# Patient Record
Sex: Female | Born: 1960 | Race: White | Hispanic: No | Smoking: Never smoker
Health system: Southern US, Community
[De-identification: ages and names within clinical notes are randomized; demographics above are authoritative.]

## PROBLEM LIST (undated history)

## (undated) ENCOUNTER — Emergency Department (HOSPITAL_COMMUNITY): Discharge status: Against Medical Advice | Payer: BC Managed Care – PPO

## (undated) DIAGNOSIS — Z9889 Other specified postprocedural states: Secondary | ICD-10-CM

## (undated) DIAGNOSIS — S36113A Laceration of liver, unspecified degree, initial encounter: Secondary | ICD-10-CM

## (undated) DIAGNOSIS — I10 Essential (primary) hypertension: Secondary | ICD-10-CM

## (undated) DIAGNOSIS — R112 Nausea with vomiting, unspecified: Secondary | ICD-10-CM

## (undated) DIAGNOSIS — M1990 Unspecified osteoarthritis, unspecified site: Secondary | ICD-10-CM

## (undated) DIAGNOSIS — R42 Dizziness and giddiness: Secondary | ICD-10-CM

## (undated) DIAGNOSIS — Z87442 Personal history of urinary calculi: Secondary | ICD-10-CM

## (undated) DIAGNOSIS — G43909 Migraine, unspecified, not intractable, without status migrainosus: Secondary | ICD-10-CM

## (undated) HISTORY — PX: CARDIAC CATHETERIZATION: SHX172

## (undated) HISTORY — PX: TUBAL LIGATION: SHX77

## (undated) HISTORY — PX: CHOLECYSTECTOMY: SHX55

## (undated) HISTORY — PX: NOSE SURGERY: SHX723

## (undated) HISTORY — PX: ABDOMINAL SURGERY: SHX537

## (undated) HISTORY — PX: LASER ABLATION: SHX1947

---

## 2001-04-28 ENCOUNTER — Other Ambulatory Visit: Admission: RE | Admit: 2001-04-28 | Discharge: 2001-04-28 | Payer: Self-pay | Admitting: Neurology

## 2001-12-06 ENCOUNTER — Encounter: Payer: Self-pay | Admitting: Emergency Medicine

## 2001-12-06 ENCOUNTER — Ambulatory Visit (HOSPITAL_COMMUNITY): Admission: RE | Admit: 2001-12-06 | Discharge: 2001-12-06 | Payer: Self-pay | Admitting: Emergency Medicine

## 2001-12-06 ENCOUNTER — Emergency Department (HOSPITAL_COMMUNITY): Admission: EM | Admit: 2001-12-06 | Discharge: 2001-12-06 | Payer: Self-pay | Admitting: Emergency Medicine

## 2003-08-03 ENCOUNTER — Ambulatory Visit (HOSPITAL_COMMUNITY): Admission: RE | Admit: 2003-08-03 | Discharge: 2003-08-03 | Payer: Self-pay | Admitting: Obstetrics & Gynecology

## 2003-08-03 ENCOUNTER — Encounter: Payer: Self-pay | Admitting: Obstetrics & Gynecology

## 2004-03-31 ENCOUNTER — Ambulatory Visit (HOSPITAL_COMMUNITY): Admission: RE | Admit: 2004-03-31 | Discharge: 2004-03-31 | Payer: Self-pay | Admitting: Family Medicine

## 2006-01-14 ENCOUNTER — Encounter: Payer: Self-pay | Admitting: Obstetrics and Gynecology

## 2006-01-14 ENCOUNTER — Ambulatory Visit (HOSPITAL_COMMUNITY): Admission: RE | Admit: 2006-01-14 | Discharge: 2006-01-14 | Payer: Self-pay | Admitting: Obstetrics and Gynecology

## 2006-01-19 ENCOUNTER — Emergency Department (HOSPITAL_COMMUNITY): Admission: EM | Admit: 2006-01-19 | Discharge: 2006-01-19 | Payer: Self-pay | Admitting: Emergency Medicine

## 2007-11-16 ENCOUNTER — Emergency Department (HOSPITAL_COMMUNITY): Admission: EM | Admit: 2007-11-16 | Discharge: 2007-11-16 | Payer: Self-pay | Admitting: Emergency Medicine

## 2008-05-17 ENCOUNTER — Other Ambulatory Visit: Admission: RE | Admit: 2008-05-17 | Discharge: 2008-05-17 | Payer: Self-pay | Admitting: Obstetrics and Gynecology

## 2008-05-25 ENCOUNTER — Ambulatory Visit (HOSPITAL_COMMUNITY): Admission: RE | Admit: 2008-05-25 | Discharge: 2008-05-25 | Payer: Self-pay | Admitting: Obstetrics and Gynecology

## 2009-01-13 ENCOUNTER — Emergency Department (HOSPITAL_COMMUNITY): Admission: EM | Admit: 2009-01-13 | Discharge: 2009-01-13 | Payer: Self-pay | Admitting: Emergency Medicine

## 2009-05-08 ENCOUNTER — Encounter (INDEPENDENT_AMBULATORY_CARE_PROVIDER_SITE_OTHER): Payer: Self-pay | Admitting: *Deleted

## 2009-05-27 ENCOUNTER — Ambulatory Visit (HOSPITAL_COMMUNITY): Admission: RE | Admit: 2009-05-27 | Discharge: 2009-05-27 | Payer: Self-pay | Admitting: Family Medicine

## 2009-05-29 ENCOUNTER — Ambulatory Visit (HOSPITAL_COMMUNITY): Admission: RE | Admit: 2009-05-29 | Discharge: 2009-05-29 | Payer: Self-pay | Admitting: Family Medicine

## 2009-06-16 ENCOUNTER — Emergency Department (HOSPITAL_COMMUNITY): Admission: EM | Admit: 2009-06-16 | Discharge: 2009-06-16 | Payer: Self-pay | Admitting: Emergency Medicine

## 2009-06-18 ENCOUNTER — Other Ambulatory Visit: Admission: RE | Admit: 2009-06-18 | Discharge: 2009-06-18 | Payer: Self-pay | Admitting: Unknown Physician Specialty

## 2009-06-18 ENCOUNTER — Encounter (INDEPENDENT_AMBULATORY_CARE_PROVIDER_SITE_OTHER): Payer: Self-pay | Admitting: Unknown Physician Specialty

## 2010-07-24 ENCOUNTER — Ambulatory Visit (HOSPITAL_COMMUNITY): Admission: RE | Admit: 2010-07-24 | Discharge: 2010-07-24 | Payer: Self-pay | Admitting: Family Medicine

## 2011-02-24 ENCOUNTER — Emergency Department (HOSPITAL_COMMUNITY)
Admission: EM | Admit: 2011-02-24 | Discharge: 2011-02-25 | Disposition: A | Discharge status: Home / Self Care | Payer: Self-pay | Attending: Emergency Medicine | Admitting: Emergency Medicine

## 2011-02-24 DIAGNOSIS — M549 Dorsalgia, unspecified: Secondary | ICD-10-CM | POA: Insufficient documentation

## 2011-02-25 ENCOUNTER — Emergency Department (HOSPITAL_COMMUNITY): Payer: Self-pay

## 2011-03-11 ENCOUNTER — Ambulatory Visit (HOSPITAL_COMMUNITY)
Admission: RE | Admit: 2011-03-11 | Discharge: 2011-03-11 | Disposition: A | Discharge status: Home / Self Care | Payer: Self-pay | Source: Ambulatory Visit | Attending: Physical Therapy | Admitting: Physical Therapy

## 2011-03-11 DIAGNOSIS — M545 Low back pain, unspecified: Secondary | ICD-10-CM | POA: Insufficient documentation

## 2011-03-11 DIAGNOSIS — IMO0001 Reserved for inherently not codable concepts without codable children: Secondary | ICD-10-CM | POA: Insufficient documentation

## 2011-03-11 DIAGNOSIS — M6281 Muscle weakness (generalized): Secondary | ICD-10-CM | POA: Insufficient documentation

## 2011-03-11 DIAGNOSIS — R262 Difficulty in walking, not elsewhere classified: Secondary | ICD-10-CM | POA: Insufficient documentation

## 2011-03-17 ENCOUNTER — Ambulatory Visit (HOSPITAL_COMMUNITY)
Admission: RE | Admit: 2011-03-17 | Discharge: 2011-03-17 | Disposition: A | Discharge status: Home / Self Care | Payer: Self-pay | Source: Ambulatory Visit | Attending: Physical Therapy | Admitting: Physical Therapy

## 2011-03-17 DIAGNOSIS — M545 Low back pain, unspecified: Secondary | ICD-10-CM | POA: Insufficient documentation

## 2011-03-17 DIAGNOSIS — R262 Difficulty in walking, not elsewhere classified: Secondary | ICD-10-CM | POA: Insufficient documentation

## 2011-03-17 DIAGNOSIS — IMO0001 Reserved for inherently not codable concepts without codable children: Secondary | ICD-10-CM | POA: Insufficient documentation

## 2011-03-17 DIAGNOSIS — M6281 Muscle weakness (generalized): Secondary | ICD-10-CM | POA: Insufficient documentation

## 2011-03-18 ENCOUNTER — Ambulatory Visit (HOSPITAL_COMMUNITY)
Admission: RE | Admit: 2011-03-18 | Discharge: 2011-03-18 | Disposition: A | Discharge status: Home / Self Care | Payer: Self-pay | Source: Ambulatory Visit

## 2011-03-24 ENCOUNTER — Ambulatory Visit (HOSPITAL_COMMUNITY)
Admission: RE | Admit: 2011-03-24 | Discharge: 2011-03-24 | Disposition: A | Discharge status: Home / Self Care | Payer: Self-pay | Source: Ambulatory Visit | Attending: *Deleted | Admitting: *Deleted

## 2011-03-26 ENCOUNTER — Ambulatory Visit (HOSPITAL_COMMUNITY)
Admission: RE | Admit: 2011-03-26 | Discharge: 2011-03-26 | Disposition: A | Discharge status: Home / Self Care | Payer: Self-pay | Source: Ambulatory Visit | Attending: *Deleted | Admitting: *Deleted

## 2011-03-30 LAB — LIPASE, BLOOD: Lipase: 22 U/L (ref 11–59)

## 2011-03-30 LAB — URINE MICROSCOPIC-ADD ON

## 2011-03-30 LAB — URINALYSIS, ROUTINE W REFLEX MICROSCOPIC
Glucose, UA: NEGATIVE mg/dL
Specific Gravity, Urine: >1.03 — ABNORMAL HIGH (ref 1.005–1.030)
Urobilinogen, UA: 0.2 mg/dL (ref 0.0–1.0)

## 2011-03-30 LAB — URINE CULTURE
Colony Count: NO GROWTH
Culture: NO GROWTH

## 2011-03-30 LAB — CBC
MCHC: 34.1 g/dL (ref 30.0–36.0)
Platelets: 159 10*3/uL (ref 150–400)
RBC: 5.12 MIL/uL — ABNORMAL HIGH (ref 3.87–5.11)
WBC: 13.4 10*3/uL — ABNORMAL HIGH (ref 4.0–10.5)

## 2011-03-30 LAB — COMPREHENSIVE METABOLIC PANEL
AST: 22 U/L (ref 0–37)
Albumin: 4.2 g/dL (ref 3.5–5.2)
Alkaline Phosphatase: 75 U/L (ref 39–117)
BUN: 9 mg/dL (ref 6–23)
CO2: 22 mEq/L (ref 19–32)
Chloride: 107 mEq/L (ref 96–112)
Creatinine, Ser: 0.59 mg/dL (ref 0.4–1.2)
GFR calc non Af Amer: >60 mL/min (ref >60)
Glucose, Bld: 145 mg/dL — ABNORMAL HIGH (ref 70–99)
Potassium: 3.5 mEq/L (ref 3.5–5.1)
Total Bilirubin: 0.7 mg/dL (ref 0.3–1.2)
Total Protein: 7.3 g/dL (ref 6.0–8.3)

## 2011-03-30 LAB — DIFFERENTIAL
Basophils Relative: 0 % (ref 0–1)
Lymphs Abs: 0.1 10*3/uL — ABNORMAL LOW (ref 0.7–4.0)
Monocytes Absolute: 0.3 10*3/uL (ref 0.1–1.0)
Monocytes Relative: 2 % — ABNORMAL LOW (ref 3–12)
Neutro Abs: 13 10*3/uL — ABNORMAL HIGH (ref 1.7–7.7)

## 2011-03-31 ENCOUNTER — Ambulatory Visit (HOSPITAL_COMMUNITY): Payer: Self-pay

## 2011-04-02 ENCOUNTER — Ambulatory Visit (HOSPITAL_COMMUNITY): Payer: Self-pay | Admitting: Physical Therapy

## 2011-05-01 NOTE — Op Note (Signed)
Emily Hawkins, Emily Hawkins                 ACCOUNT NO.:  [id number]   MEDICAL RECORD NO.:  [id number]          PATIENT TYPE:  AMB   LOCATION:  DAY                           FACILITY:  [facility]   PHYSICIAN:  Tilda Burrow, M.D. DATE OF BIRTH:  06-30-1961   DATE OF PROCEDURE:  01/14/2006  DATE OF DISCHARGE:                                 OPERATIVE REPORT   PREOPERATIVE DIAGNOSES:  1.  Menorrhagia.  2.  Dyspareunia.   POSTOPERATIVE DIAGNOSES:  1.  Menorrhagia.  2.  Dyspareunia.   PROCEDURE:  Hysteroscopy, dilation and curettage, endometrial ablation.   SURGEON:  Tilda Burrow, M.D.   ASSISTANT:  None.   ANESTHESIA:  General.   COMPLICATIONS:  None.   FINDINGS:  Uterus sounding to 8 cm, smooth endometrial cavity without  distortion of the endometrial cavity by fibroids.   DETAILS OF PROCEDURE:  The patient was taken to the operating room, prepped  and draped for a vaginal procedure with speculum inserted. Cervix was  grasped, prepped and sounded to 8 cm followed by dilation to 31-French which  allowed introduction of the rigid 30-degree hysteroscope and allowed  visualization of the uterine cavity. There was a small amount of endometrial  debris in the right cornual area. Smooth, sharp curettage was performed  which obtained a very nice endometrial sample, and then we proceeded with  reinspecting with the hysteroscope, and this showed markedly improved  cleanliness of the endometrial bed. Gynecare ThermaChoice endometrial  ablation technique was then used to perform an 8-minute thermal balloon  ablation of the endometrium. Seventeen cc of fluid was used with all 17 cc  returned at the completion of the procedure after cool down. The  procedure was considered successful, and then we reinspected the endometrial  cavity with a hysteroscope and showed good satisfactory thermal changes in  all four quadrants. The patient tolerated the procedure well, went to  recovery room in stable  condition. Sponge and needle counts were correct.      Tilda Burrow, M.D.  Electronically Signed     JVF/MEDQ  D:  01/15/2006  T:  01/16/2006  Job:  [id number]
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
## 2011-05-01 NOTE — Consult Note (Signed)
Emily Hawkins, Emily Hawkins                 ACCOUNT NO.:  [id number]   MEDICAL RECORD NO.:  [id number]          PATIENT TYPE:  EMS   LOCATION:  ED                            FACILITY:  [facility]   PHYSICIAN:  Tilda Burrow, M.D. DATE OF BIRTH:  [birth year]   DATE OF CONSULTATION:  DATE OF DISCHARGE:  01/19/2006                                   CONSULTATION   CHIEF COMPLAINT:  Abdominal pain five days status post endometrial ablation.   HISTORY OF PRESENT ILLNESS:  This 50 year old female presents from work at  [employer] where she presented for her normal work shift this a.m.  She developed onset of significant lower abdominal discomfort and has  developed nausea and thrown up. She has been afebrile since the procedure.  She is now five status post endometrial ablation from menorrhagia,  dysmenorrhea and dyspareunia. GYN history is significant for first degree  uterine descensus and moderate pelvic relaxation. She has a history of  chronic constipation. Last bowel movement was two days ago and was in  response to a suppository. She has been afebrile and did well when she  worked yesterday. She ate a breakfast sandwich this morning at 4:30 a.m.   PHYSICAL EXAMINATION:  GENERAL:  Shows a Caucasian female who appears in  moderate discomfort.  ABDOMEN:  Bowel sounds are audible with intermittent peristalsis, when  simply being in the room with the patient. Stethoscope shows bowel sounds to  be within normal range without any rushes or gurgling. Abdomen is nontender  upper abdomen. Lower abdomen has mild discomfort to palpation. There is no  rebound tenderness.  EXTERNAL GENITALIA:  Multiparous.  VAGINA EXAM:  Shows significant obstipation. Speculum exam able to be  accomplished though difficult. Uterus is retroverted. Moderate tenderness.  Cervix appears nonpurulent. GC and chlamydia cultures are performed. Adnexa  are within normal limits.   IMPRESSION:  1.  Mild uterine tenderness  status post endometrial ablation.  2.  Obstipation.  3.  Diffuse pelvic relaxation.   It is my impression that the pelvic discomfort is related to slightly tender  uterus status post endometrial ablation not unexpected, that is made worse  by the increased activity of work. The obstipation makes it worse as there  is more pelvic pressure. We will do the following:  1.  Add doxycycline 100 mg b.i.d. for 7 days as a precaution.  2.  Continue current medications which are as follows:  Motrin 800 mg q.8h. p.r.n. pain - patient not currently taking it.  Vicodin 5/500 one to two q.4h. p.r.n. pain.  Phenergan 25 mg 1 q.6h. p.r.n. nausea.  Depakote ER 250 mg 1 nightly.  Lexapro 10 mg 1 p.o. daily.  Zelnorm 6 mg 1 p.o. b.i.d.  __________ 1 scoop at bedtime.  Xanax 0.5 mg 1/2 tablet every 8 hours p.r.n. anxiety.  1.  Additionally, patient will be out of work until released. We will follow      up in three days in our office for reassessment. The patient to use      Fleet's enema when she gets home  today.   ADDENDUM:  CBC and sed rate pending.      Tilda Burrow, M.D.  Electronically Signed     JVF/MEDQ  D:  01/19/2006  T:  01/19/2006  Job:  [id number]
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
## 2011-07-27 ENCOUNTER — Other Ambulatory Visit (HOSPITAL_COMMUNITY): Payer: Self-pay | Admitting: Family Medicine

## 2011-07-27 DIAGNOSIS — Z139 Encounter for screening, unspecified: Secondary | ICD-10-CM

## 2011-08-03 ENCOUNTER — Ambulatory Visit (HOSPITAL_COMMUNITY): Payer: Self-pay

## 2011-10-24 ENCOUNTER — Encounter: Payer: Self-pay | Admitting: *Deleted

## 2011-10-24 ENCOUNTER — Emergency Department (HOSPITAL_COMMUNITY): Payer: Self-pay

## 2011-10-24 ENCOUNTER — Emergency Department (HOSPITAL_COMMUNITY)
Admission: EM | Admit: 2011-10-24 | Discharge: 2011-10-24 | Disposition: A | Discharge status: Home / Self Care | Payer: Self-pay | Attending: Emergency Medicine | Admitting: Emergency Medicine

## 2011-10-24 DIAGNOSIS — S39012A Strain of muscle, fascia and tendon of lower back, initial encounter: Secondary | ICD-10-CM

## 2011-10-24 DIAGNOSIS — IMO0002 Reserved for concepts with insufficient information to code with codable children: Secondary | ICD-10-CM | POA: Insufficient documentation

## 2011-10-24 DIAGNOSIS — S335XXA Sprain of ligaments of lumbar spine, initial encounter: Secondary | ICD-10-CM | POA: Insufficient documentation

## 2011-10-24 DIAGNOSIS — X503XXA Overexertion from repetitive movements, initial encounter: Secondary | ICD-10-CM | POA: Insufficient documentation

## 2011-10-24 DIAGNOSIS — Y9289 Other specified places as the place of occurrence of the external cause: Secondary | ICD-10-CM | POA: Insufficient documentation

## 2011-10-24 DIAGNOSIS — I10 Essential (primary) hypertension: Secondary | ICD-10-CM | POA: Insufficient documentation

## 2011-10-24 DIAGNOSIS — M5416 Radiculopathy, lumbar region: Secondary | ICD-10-CM

## 2011-10-24 HISTORY — DX: Essential (primary) hypertension: I10

## 2011-10-24 MED ORDER — IBUPROFEN 800 MG PO TABS
800.0000 mg | ORAL_TABLET | Freq: Once | ORAL | Status: AC
  Administered 2011-10-24: 800 mg via ORAL
  Filled 2011-10-24: qty 1

## 2011-10-24 MED ORDER — PREDNISONE 50 MG PO TABS: ORAL_TABLET | ORAL | Status: DC

## 2011-10-24 MED ORDER — HYDROCODONE-ACETAMINOPHEN 5-325 MG PO TABS
1.0000 | ORAL_TABLET | Freq: Once | ORAL | Status: AC
  Administered 2011-10-24: 1 via ORAL
  Filled 2011-10-24: qty 1

## 2011-10-24 MED ORDER — HYDROCODONE-ACETAMINOPHEN 5-325 MG PO TABS: 1.0000 | ORAL_TABLET | Freq: Four times a day (QID) | ORAL | Status: DC | PRN

## 2011-10-24 NOTE — ED Notes (Signed)
Pt c/o pain in her right lower back radiating to her right leg since Tuesday. Denies recent injury.

## 2011-10-24 NOTE — ED Provider Notes (Signed)
History     CSN: [id number] Arrival date & time: 10/24/2011 10:25 AM   None     Chief Complaint  Patient presents with  . Back Pain    (Consider location/radiation/quality/duration/timing/severity/associated sxs/prior treatment) HPI Comments: Pt states he had the same quality and distribution of pain after a lot of lifting on her job ~ 8 months ago.  No recent injury but hurting worse.  Patient is a 50 y.o. female presenting with back pain. The history is provided by the patient. No language interpreter was used.  Back Pain  This is a new problem. The problem occurs constantly. The pain is present in the lumbar spine. The quality of the pain is described as stabbing. The pain radiates to the right foot. The pain is at a severity of 8/10. The symptoms are aggravated by bending. Pertinent negatives include no fever. She has tried NSAIDs for the symptoms. The treatment provided no relief.    Past Medical History  Diagnosis Date  . Hypertension     Past Surgical History  Procedure Date  . Abdominal surgery   . Nose surgery   . Tubal ligation   . Cholecystectomy   . Laser ablation     uterus    History reviewed. No pertinent family history.  History  Substance Use Topics  . Smoking status: Never Smoker   . Smokeless tobacco: Not on file  . Alcohol Use: No    OB History    Grav Para Term Preterm Abortions TAB SAB Ect Mult Living                  Review of Systems  Constitutional: Negative for fever.  Musculoskeletal: Positive for back pain.    Allergies  Flagyl; Latex; Penicillins; and Sulfa antibiotics  Home Medications   Current Outpatient Rx  Name Route Sig Dispense Refill  . IBUPROFEN 200 MG PO TABS Oral Take 800 mg by mouth every 6 (six) hours as needed. For pain     . LISINOPRIL 10 MG PO TABS Oral Take 10 mg by mouth daily.      . MULTIVITAMINS PO TABS Oral Take 1 tablet by mouth daily.        BP 145/81  Pulse 70  Temp(Src) 97.4 F (36.3 C)  (Oral)  Resp 18  Ht 5\' 1"  (1.549 m)  Wt 135 lb (61.236 kg)  BMI 25.51 kg/m2  SpO2 100%  LMP 10/10/2011  Physical Exam  Nursing note and vitals reviewed. Constitutional: She is oriented to person, place, and time. She appears well-developed and well-nourished. No distress.  HENT:  Head: Normocephalic and atraumatic.  Eyes: EOM are normal.  Neck: Normal range of motion.  Cardiovascular: Normal rate, regular rhythm and normal heart sounds.   Pulmonary/Chest: Effort normal and breath sounds normal.  Abdominal: Soft. She exhibits no distension. There is no tenderness.  Musculoskeletal:       Lumbar back: She exhibits decreased range of motion, tenderness, bony tenderness and pain. She exhibits no deformity, no laceration and no spasm.       Back:  Neurological: She is alert and oriented to person, place, and time. GCS eye subscore is 4. GCS verbal subscore is 5. GCS motor subscore is 6.  Reflex Scores:      Patellar reflexes are 2+ on the right side and 2+ on the left side.      Achilles reflexes are 2+ on the right side and 2+ on the left side. Skin: Skin is warm  and dry.  Psychiatric: She has a normal mood and affect. Judgment normal.    ED Course  Procedures (including critical care time)  Labs Reviewed - No data to display Dg Lumbar Spine Complete  10/24/2011  *RADIOLOGY REPORT*  Clinical Data: Back pain x 8 months, increased over the past week  LUMBAR SPINE - COMPLETE 4+ VIEW  Comparison: 02/25/2011  Findings: Five lumbar-type vertebral bodies.  Normal lumbar lordosis.  No evidence of fracture or dislocation.  Vertebral body heights are maintained.  Very mild degenerative changes at L3-4 and L4-5.  Stable 8 mm calcification overlying the left kidney.  Cholecystectomy clips.  IMPRESSION: No fracture or dislocation is seen.  Stable 8 mm suspected left renal calculus.  Original Report Authenticated By: Charline Bills, M.D.     1. Lumbar strain   2. Lumbar radiculopathy        MDM          Worthy Rancher, PA 10/24/11 1338

## 2011-10-24 NOTE — ED Notes (Signed)
Pt a/ox4. resp even and unlabored. NAD at this time. D/C instructions and Rx x2 reviewed with pt. Pt verbalized understanding. Pt ambulated to POV with steady gate.   

## 2011-10-25 NOTE — ED Provider Notes (Signed)
Medical screening examination/treatment/procedure(s) were performed by non-physician practitioner and as supervising physician I was immediately available for consultation/collaboration.   Gerhard Munch, MD 10/25/11 [phone]

## 2011-10-28 ENCOUNTER — Emergency Department (HOSPITAL_COMMUNITY)
Admission: EM | Admit: 2011-10-28 | Discharge: 2011-10-28 | Disposition: A | Discharge status: Home / Self Care | Payer: Self-pay | Attending: Emergency Medicine | Admitting: Emergency Medicine

## 2011-10-28 ENCOUNTER — Encounter (HOSPITAL_COMMUNITY): Payer: Self-pay | Admitting: *Deleted

## 2011-10-28 ENCOUNTER — Emergency Department (HOSPITAL_COMMUNITY): Payer: Self-pay

## 2011-10-28 ENCOUNTER — Other Ambulatory Visit: Payer: Self-pay

## 2011-10-28 DIAGNOSIS — D72829 Elevated white blood cell count, unspecified: Secondary | ICD-10-CM | POA: Insufficient documentation

## 2011-10-28 DIAGNOSIS — R55 Syncope and collapse: Secondary | ICD-10-CM | POA: Insufficient documentation

## 2011-10-28 DIAGNOSIS — R42 Dizziness and giddiness: Secondary | ICD-10-CM | POA: Insufficient documentation

## 2011-10-28 DIAGNOSIS — I10 Essential (primary) hypertension: Secondary | ICD-10-CM | POA: Insufficient documentation

## 2011-10-28 LAB — CBC
Hemoglobin: 15 g/dL (ref 12.0–15.0)
MCHC: 32.3 g/dL (ref 30.0–36.0)
Platelets: 189 10*3/uL (ref 150–400)
RDW: 13.1 % (ref 11.5–15.5)

## 2011-10-28 LAB — COMPREHENSIVE METABOLIC PANEL
ALT: 15 U/L (ref 0–35)
AST: 14 U/L (ref 0–37)
Albumin: 4.2 g/dL (ref 3.5–5.2)
Alkaline Phosphatase: 93 U/L (ref 39–117)
Glucose, Bld: 93 mg/dL (ref 70–99)
Potassium: 3.6 mEq/L (ref 3.5–5.1)
Sodium: 138 mEq/L (ref 135–145)
Total Protein: 8.1 g/dL (ref 6.0–8.3)

## 2011-10-28 LAB — CARDIAC PANEL(CRET KIN+CKTOT+MB+TROPI)
Relative Index: INVALID (ref 0.0–2.5)
Troponin I: <0.3 ng/mL (ref <0.30)

## 2011-10-28 MED ORDER — SODIUM CHLORIDE 0.9 % IV SOLN
999.0000 mL | Freq: Once | INTRAVENOUS | Status: AC
  Administered 2011-10-28: 999 mL via INTRAVENOUS

## 2011-10-28 NOTE — ED Notes (Signed)
MD at bedside. 

## 2011-10-28 NOTE — ED Provider Notes (Signed)
History     CSN: [id number] Arrival date & time: 10/28/2011  3:24 PM   First MD Initiated Contact with Patient 10/28/11 1532      Chief Complaint  Patient presents with  . Dizziness    (Consider location/radiation/quality/duration/timing/severity/associated sxs/prior treatment) HPI The patient notes that since that presentation to the ED for low back pain 5 days ago, she has been lightheaded, nearly losing consciousness multiple times.  She was discharged with Norco and prednisone for her back pain. She notes that the day following her presentation she had mild nausea, when she took both her steroid and analgesics. She now notes that for the subsequent 3 days she has been episodically near-syncopal, worse with arising, minimally better in supine position. She notes that her back pain is better, and she has no other pain; no chest pain, no headache. No nausea, no emesis, no visual changes no room spinning sensation.   Past Medical History  Diagnosis Date  . Hypertension     Past Surgical History  Procedure Date  . Abdominal surgery   . Nose surgery   . Tubal ligation   . Cholecystectomy   . Laser ablation     uterus    History reviewed. No pertinent family history.  History  Substance Use Topics  . Smoking status: Never Smoker   . Smokeless tobacco: Not on file  . Alcohol Use: No    OB History    Grav Para Term Preterm Abortions TAB SAB Ect Mult Living                  Review of Systems Gen: Per HPI HEENT: No HA CV: No CP Resp: No dyspnea Abd: Per HPI, otherwise negative Musk: Per HPI, otherwise negative Neuro: No dysesthesia, or other focal changes GU: Per HPI, otherwise negative Skin: Neg Psych: Neg  Allergies  Flagyl; Latex; Penicillins; and Sulfa antibiotics  Home Medications   Current Outpatient Rx  Name Route Sig Dispense Refill  . HYDROCODONE-ACETAMINOPHEN 5-325 MG PO TABS Oral Take 1 tablet by mouth every 6 (six) hours as needed for pain. 20  tablet 0  . IBUPROFEN 200 MG PO TABS Oral Take 800 mg by mouth every 6 (six) hours as needed. For pain     . LISINOPRIL 10 MG PO TABS Oral Take 10 mg by mouth daily.      . MULTIVITAMINS PO TABS Oral Take 1 tablet by mouth daily.      Marland Kitchen PREDNISONE 50 MG PO TABS  One tablet QD. 6 tablet 0    BP 136/77  Pulse 81  Temp(Src) 98.6 F (37 C) (Oral)  Resp 16  Ht 5\' 1"  (1.549 m)  Wt 135 lb (61.236 kg)  BMI 25.51 kg/m2  SpO2 100%  LMP 10/10/2011  Physical Exam  Constitutional: She is oriented to person, place, and time. She appears well-developed and well-nourished.  HENT:  Head: Normocephalic and atraumatic.  Eyes: EOM are normal.  Cardiovascular: Normal rate and regular rhythm.   Pulmonary/Chest: Effort normal and breath sounds normal.  Abdominal: She exhibits no distension.  Musculoskeletal: She exhibits no edema and no tenderness.  Neurological: She is alert and oriented to person, place, and time.  Skin: Skin is warm and dry.    ED Course  Procedures (including critical care time)   Labs Reviewed  CARDIAC PANEL(CRET KIN+CKTOT+MB+TROPI)  CBC  COMPREHENSIVE METABOLIC PANEL  MAGNESIUM   No results found.   No diagnosis found.    MDM  This 50 year old  female now presents with new lightheadedness. Notably prior to the recent presentation for low back pain, she had no similar episodes. On exam the patient is in no distress, and has no focal neurological findings. The patient's ECG did not demonstrate dysrhythmia and her laboratory evaluation was unremarkable. (Trace leukocytosis) given the absence of focal findings, the absence of distress, and with stable vital signs, the patient will be discharged to continue evaluation of her complaints with her primary care physician. The patient's symptoms are possibly due to medication from her recent back pain episode, although other possibilities including dysrhythmia, occult infection, are considered and have been discussed with the  patient and her mother        Gerhard Munch, MD 10/28/11 1730

## 2011-10-28 NOTE — ED Notes (Signed)
Patient c/o dizziness since Sunday after taking prednisone , pt quit taking the prednisone due to dizziness on Sunday, c/o numbness feeling to back of head

## 2011-10-28 NOTE — ED Notes (Signed)
Pt states she was seen here on Saturday for back pain and given pain meds and prednisone; pt states the prednisone made her feel dizzy so she stopped taking them x 4 days ago but continues to feel dizzy

## 2012-01-25 ENCOUNTER — Emergency Department (HOSPITAL_COMMUNITY)
Admission: EM | Admit: 2012-01-25 | Discharge: 2012-01-25 | Disposition: A | Discharge status: Home / Self Care | Payer: Self-pay | Attending: Emergency Medicine | Admitting: Emergency Medicine

## 2012-01-25 ENCOUNTER — Encounter (HOSPITAL_COMMUNITY): Payer: Self-pay | Admitting: Emergency Medicine

## 2012-01-25 DIAGNOSIS — Z79899 Other long term (current) drug therapy: Secondary | ICD-10-CM | POA: Insufficient documentation

## 2012-01-25 DIAGNOSIS — R61 Generalized hyperhidrosis: Secondary | ICD-10-CM | POA: Insufficient documentation

## 2012-01-25 DIAGNOSIS — R05 Cough: Secondary | ICD-10-CM | POA: Insufficient documentation

## 2012-01-25 DIAGNOSIS — R51 Headache: Secondary | ICD-10-CM | POA: Insufficient documentation

## 2012-01-25 DIAGNOSIS — J111 Influenza due to unidentified influenza virus with other respiratory manifestations: Secondary | ICD-10-CM | POA: Insufficient documentation

## 2012-01-25 DIAGNOSIS — I10 Essential (primary) hypertension: Secondary | ICD-10-CM | POA: Insufficient documentation

## 2012-01-25 DIAGNOSIS — R509 Fever, unspecified: Secondary | ICD-10-CM | POA: Insufficient documentation

## 2012-01-25 DIAGNOSIS — R059 Cough, unspecified: Secondary | ICD-10-CM | POA: Insufficient documentation

## 2012-01-25 DIAGNOSIS — R070 Pain in throat: Secondary | ICD-10-CM | POA: Insufficient documentation

## 2012-01-25 DIAGNOSIS — J3489 Other specified disorders of nose and nasal sinuses: Secondary | ICD-10-CM | POA: Insufficient documentation

## 2012-01-25 DIAGNOSIS — IMO0001 Reserved for inherently not codable concepts without codable children: Secondary | ICD-10-CM | POA: Insufficient documentation

## 2012-01-25 MED ORDER — OSELTAMIVIR PHOSPHATE 75 MG PO CAPS: 75.0000 mg | ORAL_CAPSULE | Freq: Two times a day (BID) | ORAL | Status: AC

## 2012-01-25 MED ORDER — IBUPROFEN 800 MG PO TABS
800.0000 mg | ORAL_TABLET | Freq: Once | ORAL | Status: AC
  Administered 2012-01-25: 800 mg via ORAL
  Filled 2012-01-25: qty 1

## 2012-01-25 MED ORDER — GUAIFENESIN-CODEINE 100-10 MG/5ML PO SYRP: 10.0000 mL | ORAL_SOLUTION | Freq: Three times a day (TID) | ORAL | Status: AC | PRN

## 2012-01-25 MED ORDER — GUAIFENESIN-CODEINE 100-10 MG/5ML PO SOLN
10.0000 mL | Freq: Once | ORAL | Status: AC
  Administered 2012-01-25: 10 mL via ORAL
  Filled 2012-01-25 (×2): qty 5

## 2012-01-25 NOTE — ED Provider Notes (Signed)
History     CSN: [id number]  Arrival date & time 01/25/12  2135   First MD Initiated Contact with Patient 01/25/12 2146      Chief Complaint  Patient presents with  . Influenza    (Consider location/radiation/quality/duration/timing/severity/associated sxs/prior treatment) HPI Comments: Patient c/o sudden onset of body aches, fever, chills, nasal congestion and cough earlier on the day of ED arrival.  States she has not taken any medications this evening.  She also states her husband has similar symptoms last week.  She denies vomiting, chest pain or abdominal pain  Patient is a 51 y.o. female presenting with cough. The history is provided by the patient. No language interpreter was used.  Cough This is a new problem. The current episode started 6 to 12 hours ago. The problem occurs constantly. The problem has not changed since onset.The cough is non-productive. The maximum temperature recorded prior to her arrival was 100 to 100.9 F. Associated symptoms include chills, sweats, headaches, rhinorrhea, sore throat and myalgias. Pertinent negatives include no chest pain, no ear congestion, no shortness of breath, no wheezing and no eye redness. Associated symptoms comments: Nasal congestion. She has tried nothing for the symptoms. The treatment provided no relief. She is not a smoker. Her past medical history does not include bronchitis, pneumonia or asthma.    Past Medical History  Diagnosis Date  . Hypertension     Past Surgical History  Procedure Date  . Abdominal surgery   . Nose surgery   . Tubal ligation   . Cholecystectomy   . Laser ablation     uterus    History reviewed. No pertinent family history.  History  Substance Use Topics  . Smoking status: Never Smoker   . Smokeless tobacco: Not on file  . Alcohol Use: No    OB History    Grav Para Term Preterm Abortions TAB SAB Ect Mult Living                  Review of Systems  Constitutional: Positive for fever  and chills. Negative for appetite change and fatigue.  HENT: Positive for congestion, sore throat, rhinorrhea and sneezing. Negative for trouble swallowing, neck pain and neck stiffness.   Eyes: Negative for redness.  Respiratory: Positive for cough. Negative for chest tightness, shortness of breath and wheezing.   Cardiovascular: Negative for chest pain and palpitations.  Gastrointestinal: Negative for nausea, vomiting, abdominal pain and diarrhea.  Genitourinary: Negative for dysuria, hematuria, flank pain and difficulty urinating.  Musculoskeletal: Positive for myalgias. Negative for back pain and arthralgias.  Skin: Negative.  Negative for rash.  Neurological: Positive for headaches. Negative for dizziness, weakness and numbness.  Hematological: Negative for adenopathy. Does not bruise/bleed easily.  All other systems reviewed and are negative.    Allergies  Flagyl; Latex; Oxycodone; Penicillins; and Sulfa antibiotics  Home Medications   Current Outpatient Rx  Name Route Sig Dispense Refill  . IBUPROFEN 200 MG PO TABS Oral Take 800 mg by mouth every 6 (six) hours as needed. For pain     . LISINOPRIL 10 MG PO TABS Oral Take 10 mg by mouth daily.      . ADULT MULTIVITAMIN W/MINERALS CH Oral Take 1 tablet by mouth daily.    Marland Kitchen SINUS RELIEF PO Oral Take 1 tablet by mouth as needed. For congestion      BP 135/73  Pulse 101  Temp(Src) 99.5 F (37.5 C) (Oral)  Resp 18  Ht 5\' 1"  (1.549  m)  Wt 130 lb (58.968 kg)  BMI 24.56 kg/m2  SpO2 100%  Physical Exam  Nursing note and vitals reviewed. Constitutional: She is oriented to person, place, and time. She appears well-developed and well-nourished. No distress.  HENT:  Head: Normocephalic and atraumatic.  Right Ear: Tympanic membrane and ear canal normal.  Left Ear: Tympanic membrane and ear canal normal.  Mouth/Throat: Uvula is midline, oropharynx is clear and moist and mucous membranes are normal.  Eyes: EOM are normal. Pupils  are equal, round, and reactive to light.  Neck: Normal range of motion. Neck supple.  Cardiovascular: Normal rate, regular rhythm, normal heart sounds and intact distal pulses.   No murmur heard. Pulmonary/Chest: Effort normal and breath sounds normal. No respiratory distress. She has no wheezes. She has no rales. She exhibits no tenderness.  Abdominal: Soft. There is no tenderness.  Musculoskeletal: Normal range of motion. She exhibits no tenderness.  Lymphadenopathy:    She has no cervical adenopathy.  Neurological: She is alert and oriented to person, place, and time. No cranial nerve deficit. She exhibits normal muscle tone. Coordination normal.  Skin: Skin is warm and dry.    ED Course  Procedures (including critical care time)       MDM    Patient vitals are stable.  Non-toxic appearing.  No meningeal signs.  Likely influenza.  Pt agrees to close f/u with her PMD for recheck.  Patient / Family / Caregiver understand and agree with initial ED impression and plan with expectations set for ED visit. Pt stable in ED with no significant deterioration in condition.       Igor Bishop L. Yaniel Limbaugh, PA 01/26/12 2200

## 2012-01-25 NOTE — ED Notes (Signed)
Exam by Iva Lento PA.

## 2012-01-25 NOTE — ED Notes (Signed)
Patient c/o fever, chills, sore throat, headache and cough that started this morning.  Patient denies nausea, vomiting or diarrhea.

## 2012-01-26 NOTE — ED Provider Notes (Signed)
Medical screening examination/treatment/procedure(s) were performed by non-physician practitioner and as supervising physician I was immediately available for consultation/collaboration.  Nicoletta Dress. Colon Branch, MD 01/26/12 2226

## 2012-05-18 ENCOUNTER — Emergency Department (HOSPITAL_COMMUNITY): Payer: Self-pay

## 2012-05-18 ENCOUNTER — Emergency Department (HOSPITAL_COMMUNITY)
Admission: EM | Admit: 2012-05-18 | Discharge: 2012-05-18 | Disposition: A | Discharge status: Home / Self Care | Payer: Self-pay | Attending: Emergency Medicine | Admitting: Emergency Medicine

## 2012-05-18 ENCOUNTER — Encounter (HOSPITAL_COMMUNITY): Payer: Self-pay | Admitting: *Deleted

## 2012-05-18 DIAGNOSIS — R531 Weakness: Secondary | ICD-10-CM

## 2012-05-18 DIAGNOSIS — M25519 Pain in unspecified shoulder: Secondary | ICD-10-CM | POA: Insufficient documentation

## 2012-05-18 DIAGNOSIS — I10 Essential (primary) hypertension: Secondary | ICD-10-CM | POA: Insufficient documentation

## 2012-05-18 DIAGNOSIS — R5381 Other malaise: Secondary | ICD-10-CM | POA: Insufficient documentation

## 2012-05-18 DIAGNOSIS — W19XXXA Unspecified fall, initial encounter: Secondary | ICD-10-CM | POA: Insufficient documentation

## 2012-05-18 DIAGNOSIS — T1490XA Injury, unspecified, initial encounter: Secondary | ICD-10-CM | POA: Insufficient documentation

## 2012-05-18 DIAGNOSIS — M25511 Pain in right shoulder: Secondary | ICD-10-CM

## 2012-05-18 LAB — COMPREHENSIVE METABOLIC PANEL
ALT: 13 U/L (ref 0–35)
Albumin: 3.9 g/dL (ref 3.5–5.2)
Alkaline Phosphatase: 82 U/L (ref 39–117)
BUN: 10 mg/dL (ref 6–23)
Chloride: 103 mEq/L (ref 96–112)
Potassium: 4 mEq/L (ref 3.5–5.1)
Sodium: 139 mEq/L (ref 135–145)
Total Bilirubin: 0.4 mg/dL (ref 0.3–1.2)

## 2012-05-18 LAB — DIFFERENTIAL
Basophils Relative: 0 % (ref 0–1)
Lymphs Abs: 1.2 10*3/uL (ref 0.7–4.0)
Monocytes Relative: 6 % (ref 3–12)
Neutro Abs: 4.8 10*3/uL (ref 1.7–7.7)
Neutrophils Relative %: 75 % (ref 43–77)

## 2012-05-18 LAB — URINALYSIS, ROUTINE W REFLEX MICROSCOPIC
Glucose, UA: NEGATIVE mg/dL
Specific Gravity, Urine: 1.015 (ref 1.005–1.030)
pH: 6 (ref 5.0–8.0)

## 2012-05-18 LAB — URINE MICROSCOPIC-ADD ON

## 2012-05-18 LAB — TSH: TSH: 1.203 u[IU]/mL (ref 0.350–4.500)

## 2012-05-18 LAB — CBC
Hemoglobin: 14.6 g/dL (ref 12.0–15.0)
Platelets: 162 10*3/uL (ref 150–400)
RBC: 4.88 MIL/uL (ref 3.87–5.11)

## 2012-05-18 MED ORDER — ACETAMINOPHEN 500 MG PO TABS
ORAL_TABLET | ORAL | Status: AC
  Administered 2012-05-18: 1000 mg via ORAL
  Filled 2012-05-18: qty 2

## 2012-05-18 MED ORDER — ACETAMINOPHEN 500 MG PO TABS
1000.0000 mg | ORAL_TABLET | Freq: Once | ORAL | Status: AC
  Administered 2012-05-18: 1000 mg via ORAL

## 2012-05-18 NOTE — ED Notes (Signed)
Dr. Cook at bedside to examine.

## 2012-05-18 NOTE — ED Notes (Signed)
C/o right shoulder pain s/p fall 2 days ago; states was working at home, and fell from approx  "one step" from standing position to floor.  Also, c/o frequent urination, increased thirst, increased appetite; states, "I think I have diabetes.  It runs in my family".

## 2012-05-18 NOTE — ED Provider Notes (Signed)
History    This chart was scribed for Emily Hutching, MD, MD by Smitty Pluck. The patient was seen in room [facility]18 and the patient's care was started at 9:58AM.   CSN: [id number]  Arrival date & time 05/18/12  0918   First MD Initiated Contact with Patient 05/18/12 [phone]      Chief Complaint  Patient presents with  . Fall  . Shoulder Pain    (Consider location/radiation/quality/duration/timing/severity/associated sxs/prior treatment) The history is provided by the patient.   NAVA SONG is a 51 y.o. female who presents to the Emergency Department complaining of moderate right shoulder pain onset 2 days ago due to fall while trying to clean house, frequent urination, thirst, fatigue, blurred vision, headaches and increased appetite onset 2.5 weeks. Pt reports getting new glasses thinking that this was the source of the headaches but she still has headaches. Pt fell from 1 step. Pt reports that pain is constant without radiation. Pt reports that she had an episode of dizziness while at work. Denies loss of weight.  Pt goes   Past Medical History  Diagnosis Date  . Hypertension     Past Surgical History  Procedure Date  . Abdominal surgery   . Nose surgery   . Tubal ligation   . Cholecystectomy   . Laser ablation     uterus    No family history on file.  History  Substance Use Topics  . Smoking status: Never Smoker   . Smokeless tobacco: Not on file  . Alcohol Use: No    OB History    Grav Para Term Preterm Abortions TAB SAB Ect Mult Living                  Review of Systems  All other systems reviewed and are negative.   10 Systems reviewed and all are negative for acute change except as noted in the HPI.   Allergies  Flagyl; Latex; Oxycodone; Penicillins; and Sulfa antibiotics  Home Medications   Current Outpatient Rx  Name Route Sig Dispense Refill  . IBUPROFEN 200 MG PO TABS Oral Take 800 mg by mouth every 6 (six) hours as needed. For pain     .  LISINOPRIL 10 MG PO TABS Oral Take 10 mg by mouth daily.      . ADULT MULTIVITAMIN W/MINERALS CH Oral Take 1 tablet by mouth daily.    Marland Kitchen SINUS RELIEF PO Oral Take 1 tablet by mouth as needed. For congestion      BP 154/64  Pulse 73  Temp(Src) 98.4 F (36.9 C) (Oral)  Resp 20  Ht 5\' 1"  (1.549 m)  Wt 130 lb (58.968 kg)  BMI 24.56 kg/m2  SpO2 100%  Physical Exam  Nursing note and vitals reviewed. Constitutional: She is oriented to person, place, and time. She appears well-developed and well-nourished. No distress.  HENT:  Head: Normocephalic and atraumatic.  Eyes: Conjunctivae are normal. Pupils are equal, round, and reactive to light.  Cardiovascular: Normal rate, regular rhythm and normal heart sounds.   Pulmonary/Chest: Effort normal and breath sounds normal. No respiratory distress.  Abdominal: Soft. She exhibits no distension.  Musculoskeletal:       Tender in posterior right shoulder Pain with ROM   Neurological: She is alert and oriented to person, place, and time.  Skin: Skin is warm and dry.  Psychiatric: She has a normal mood and affect. Her behavior is normal.    ED Course  Procedures (including critical care time)  DIAGNOSTIC STUDIES: Oxygen Saturation is 100% on room air, normal by my interpretation.    COORDINATION OF CARE: 10:00AM EDP discusses pt ED treatment with pt. Pt will be given pain medication, EKG, blood labs (CBC, UA and TSA).    Labs Reviewed  URINALYSIS, ROUTINE W REFLEX MICROSCOPIC - Abnormal; Notable for the following:    Hgb urine dipstick LARGE (*)    Leukocytes, UA TRACE (*)    All other components within normal limits  COMPREHENSIVE METABOLIC PANEL - Abnormal; Notable for the following:    Glucose, Bld 128 (*)    All other components within normal limits  GLUCOSE, CAPILLARY  URINE MICROSCOPIC-ADD ON  CBC  DIFFERENTIAL  TSH   Dg Shoulder Right  05/18/2012  *RADIOLOGY REPORT*  Clinical Data: Fall.  Right shoulder pain.  RIGHT SHOULDER  - 2+ VIEW  Comparison: 10/28/2011 chest x-ray.  Findings: No fracture or dislocation.  Visualized lungs clear.  IMPRESSION: No fracture.  Original Report Authenticated By: Fuller Canada, M.D.    Date: 05/18/2012  Rate: 65  Rhythm: normal sinus rhythm  QRS Axis: normal  Intervals: normal  ST/T Wave abnormalities: normal  Conduction Disutrbances:none  Narrative Interpretation:   Old EKG Reviewed: unchanged c SA  No diagnosis found.    MDM  X-ray right shoulder negative. Blood work, urinalysis, EKG show no acute findings.  Test results were discussed with patient. TSH pending. She will call back in 2 days to get results.  I personally performed the services described in this documentation, which was scribed in my presence. The recorded information has been reviewed and considered.        Emily Hutching, MD 05/25/12 1436

## 2012-05-18 NOTE — Discharge Instructions (Signed)
Tylenol or Motrin for pain. Blood work showed a very mildly elevated glucose. Urinalysis showed blood which is probably related to your menstrual cycle. Otherwise no explanation today for your symptoms.  Followup [facility] health Department.  X-ray of shoulder negative

## 2012-05-18 NOTE — ED Notes (Signed)
C/o HA; will notify MD.

## 2012-05-18 NOTE — ED Notes (Signed)
CBG=94 mg/dl 

## 2012-05-18 NOTE — ED Notes (Signed)
Left in c/o mother for transport; instructions reviewed and f/u information provided; alert, in no distress.  Verbalizes understanding of instructions given.

## 2013-04-03 ENCOUNTER — Emergency Department (HOSPITAL_COMMUNITY)
Admission: EM | Admit: 2013-04-03 | Discharge: 2013-04-03 | Disposition: A | Discharge status: Home / Self Care | Payer: BC Managed Care – PPO | Attending: Emergency Medicine | Admitting: Emergency Medicine

## 2013-04-03 ENCOUNTER — Emergency Department (HOSPITAL_COMMUNITY): Payer: BC Managed Care – PPO

## 2013-04-03 ENCOUNTER — Encounter (HOSPITAL_COMMUNITY): Payer: Self-pay | Admitting: *Deleted

## 2013-04-03 DIAGNOSIS — Z9089 Acquired absence of other organs: Secondary | ICD-10-CM | POA: Insufficient documentation

## 2013-04-03 DIAGNOSIS — Z9889 Other specified postprocedural states: Secondary | ICD-10-CM | POA: Insufficient documentation

## 2013-04-03 DIAGNOSIS — R110 Nausea: Secondary | ICD-10-CM | POA: Insufficient documentation

## 2013-04-03 DIAGNOSIS — K5900 Constipation, unspecified: Secondary | ICD-10-CM | POA: Insufficient documentation

## 2013-04-03 DIAGNOSIS — N23 Unspecified renal colic: Secondary | ICD-10-CM

## 2013-04-03 DIAGNOSIS — Z9851 Tubal ligation status: Secondary | ICD-10-CM | POA: Insufficient documentation

## 2013-04-03 DIAGNOSIS — Z880 Allergy status to penicillin: Secondary | ICD-10-CM | POA: Insufficient documentation

## 2013-04-03 DIAGNOSIS — Z91040 Latex allergy status: Secondary | ICD-10-CM | POA: Insufficient documentation

## 2013-04-03 DIAGNOSIS — Z79899 Other long term (current) drug therapy: Secondary | ICD-10-CM | POA: Insufficient documentation

## 2013-04-03 DIAGNOSIS — R141 Gas pain: Secondary | ICD-10-CM | POA: Insufficient documentation

## 2013-04-03 DIAGNOSIS — I10 Essential (primary) hypertension: Secondary | ICD-10-CM | POA: Insufficient documentation

## 2013-04-03 DIAGNOSIS — M549 Dorsalgia, unspecified: Secondary | ICD-10-CM | POA: Insufficient documentation

## 2013-04-03 DIAGNOSIS — R143 Flatulence: Secondary | ICD-10-CM | POA: Insufficient documentation

## 2013-04-03 DIAGNOSIS — R142 Eructation: Secondary | ICD-10-CM | POA: Insufficient documentation

## 2013-04-03 DIAGNOSIS — IMO0001 Reserved for inherently not codable concepts without codable children: Secondary | ICD-10-CM | POA: Insufficient documentation

## 2013-04-03 LAB — BASIC METABOLIC PANEL
CO2: 27 mEq/L (ref 19–32)
Glucose, Bld: 100 mg/dL — ABNORMAL HIGH (ref 70–99)
Potassium: 3.7 mEq/L (ref 3.5–5.1)
Sodium: 138 mEq/L (ref 135–145)

## 2013-04-03 LAB — URINALYSIS, ROUTINE W REFLEX MICROSCOPIC
Glucose, UA: NEGATIVE mg/dL
Specific Gravity, Urine: 1.015 (ref 1.005–1.030)
Urobilinogen, UA: 1 mg/dL (ref 0.0–1.0)

## 2013-04-03 LAB — CBC WITH DIFFERENTIAL/PLATELET
Basophils Absolute: 0 10*3/uL (ref 0.0–0.1)
Lymphocytes Relative: 24 % (ref 12–46)
Lymphs Abs: 2.5 10*3/uL (ref 0.7–4.0)
Neutro Abs: 7 10*3/uL (ref 1.7–7.7)
Neutrophils Relative %: 67 % (ref 43–77)
Platelets: 176 10*3/uL (ref 150–400)
RBC: 4.69 MIL/uL (ref 3.87–5.11)
WBC: 10.4 10*3/uL (ref 4.0–10.5)

## 2013-04-03 LAB — URINE MICROSCOPIC-ADD ON

## 2013-04-03 MED ORDER — KETOROLAC TROMETHAMINE 10 MG PO TABS: 10.0000 mg | ORAL_TABLET | Freq: Four times a day (QID) | ORAL | Status: DC | PRN

## 2013-04-03 MED ORDER — KETOROLAC TROMETHAMINE 60 MG/2ML IM SOLN
60.0000 mg | Freq: Once | INTRAMUSCULAR | Status: AC
  Administered 2013-04-03: 60 mg via INTRAMUSCULAR
  Filled 2013-04-03: qty 2

## 2013-04-03 MED ORDER — ONDANSETRON 4 MG PO TBDP: ORAL_TABLET | ORAL | Status: DC

## 2013-04-03 NOTE — ED Provider Notes (Signed)
History     CSN: [id number]  Arrival date & time 04/03/13  1700   First MD Initiated Contact with Patient 04/03/13 1927      Chief Complaint  Patient presents with  . Flank Pain    (Consider location/radiation/quality/duration/timing/severity/associated sxs/prior treatment) HPI Pt with 1 week of L flank pain radiating to L abd. Pain is intermittent and occasionally associated with nausea. No previously similar pain. No fever or chills. +constipation. No vomiting, fever, chills, hematuria, dysuria.  Past Medical History  Diagnosis Date  . Hypertension     Past Surgical History  Procedure Laterality Date  . Abdominal surgery    . Nose surgery    . Tubal ligation    . Cholecystectomy    . Laser ablation      uterus    No family history on file.  History  Substance Use Topics  . Smoking status: Never Smoker   . Smokeless tobacco: Not on file  . Alcohol Use: No    OB History   Grav Para Term Preterm Abortions TAB SAB Ect Mult Living                  Review of Systems  Constitutional: Negative for fever and chills.  HENT: Negative for neck pain.   Respiratory: Negative for cough and shortness of breath.   Cardiovascular: Negative for chest pain and palpitations.  Gastrointestinal: Positive for nausea, abdominal pain and constipation. Negative for vomiting, diarrhea and abdominal distention.  Genitourinary: Positive for flank pain. Negative for dysuria, frequency, hematuria, vaginal bleeding, vaginal discharge, difficulty urinating and pelvic pain.  Musculoskeletal: Positive for myalgias and back pain.  Skin: Negative for rash and wound.  Neurological: Negative for dizziness, syncope, weakness, light-headedness, numbness and headaches.  All other systems reviewed and are negative.    Allergies  Flagyl; Latex; Oxycodone; Penicillins; and Sulfa antibiotics  Home Medications   Current Outpatient Rx  Name  Route  Sig  Dispense  Refill  . lisinopril  (PRINIVIL,ZESTRIL) 10 MG tablet   Oral   Take 10 mg by mouth daily.           . Multiple Vitamin (MULITIVITAMIN WITH MINERALS) TABS   Oral   Take 1 tablet by mouth daily.         . Pseudoephedrine-Acetaminophen (SINUS RELIEF PO)   Oral   Take 1 tablet by mouth as needed. For congestion         . ibuprofen (ADVIL,MOTRIN) 200 MG tablet   Oral   Take 800 mg by mouth every 6 (six) hours as needed. For pain          . ketorolac (TORADOL) 10 MG tablet   Oral   Take 1 tablet (10 mg total) by mouth every 6 (six) hours as needed for pain.   20 tablet   0   . ondansetron (ZOFRAN ODT) 4 MG disintegrating tablet      4mg  ODT q4 hours prn nausea/vomit   8 tablet   0     BP 132/69  Pulse 73  Temp(Src) 98 F (36.7 C) (Oral)  Resp 18  Ht 5\' 1"  (1.549 m)  Wt 140 lb (63.504 kg)  BMI 26.47 kg/m2  SpO2 100%  Physical Exam  Nursing note and vitals reviewed. Constitutional: She is oriented to person, place, and time. She appears well-developed and well-nourished. No distress.  HENT:  Head: Normocephalic and atraumatic.  Mouth/Throat: Oropharynx is clear and moist.  Eyes: EOM are normal. Pupils are  equal, round, and reactive to light.  Neck: Normal range of motion. Neck supple.  Cardiovascular: Normal rate and regular rhythm.   Pulmonary/Chest: Effort normal and breath sounds normal. No respiratory distress. She has no wheezes. She has no rales.  Abdominal: Soft. Bowel sounds are normal. She exhibits distension. She exhibits no mass. There is no tenderness. There is no rebound and no guarding.  Musculoskeletal: Normal range of motion. She exhibits tenderness (TTP over L lumbar paraspinal tenderness and L flank tenderness. No midline tenderness to palpation). She exhibits no edema.  Neurological: She is alert and oriented to person, place, and time.  5/5 motor in all ext, sensation intact.   Skin: Skin is warm and dry. No rash noted. No erythema.  Psychiatric: She has a normal  mood and affect. Her behavior is normal.    ED Course  Procedures (including critical care time)  Labs Reviewed  BASIC METABOLIC PANEL - Abnormal; Notable for the following:    Glucose, Bld 100 (*)    All other components within normal limits  URINALYSIS, ROUTINE W REFLEX MICROSCOPIC - Abnormal; Notable for the following:    Hgb urine dipstick LARGE (*)    Leukocytes, UA SMALL (*)    All other components within normal limits  URINE MICROSCOPIC-ADD ON - Abnormal; Notable for the following:    Squamous Epithelial / LPF FEW (*)    All other components within normal limits  CBC WITH DIFFERENTIAL   Ct Abdomen Pelvis Wo Contrast  04/03/2013  *RADIOLOGY REPORT*  Clinical Data: Left-sided flank pain and hematuria.  Prior endometrial ablation.  Cholecystectomy.  Liver laceration.  CT ABDOMEN AND PELVIS WITHOUT CONTRAST  Technique:  Multidetector CT imaging of the abdomen and pelvis was performed following the standard protocol without intravenous contrast.  Comparison: 11/10/2012plain film  Findings: Lung bases:  Clear lung bases.  Mild cardiomegaly, without pericardial or pleural effusion.  Abdomen/pelvis:  Normal uninfused appearance of the liver, spleen, stomach, pancreas. Cholecystectomy without biliary ductal dilatation.  Normal adrenal glands.  Normal appearance of the right kidney.  A left ureteral pelvic junction calculus measures 8 by 9 mm on image 31/series 2.  No significant urinary tract obstruction.  No distal hydroureter or ureteric stone.  No retroperitoneal or retrocrural adenopathy.  Normal colon, appendix, and terminal ileum.  Normal small bowel without abdominal ascites.  No pelvic adenopathy.  Normal urinary bladder.  Retroverted uterus. No adnexal mass or significant free fluid.  Bones/Musculoskeletal:  Right iliac bone island. No acute osseous abnormality.  IMPRESSION:  8 x 9 mm left ureteropelvic junction calculus with no significant urinary tract obstruction.   Original Report  Authenticated By: Jeronimo Greaves, M.D.      1. Renal colic on left side       MDM   Referred to urology for follow-up. Return precautions given       Loren Racer, MD 04/03/13 2138

## 2013-04-03 NOTE — ED Notes (Signed)
Left flank pain radiating to abd with nausea x 1 wk.  Denies hematuria.  Denies v/d.

## 2013-04-03 NOTE — ED Notes (Signed)
Patient given a drink and crackers per MD approval.

## 2013-04-03 NOTE — ED Notes (Signed)
Pt c/o left sided flank pain x 1 week with nausea starting today. Denies pain with urination or emesis.

## 2013-04-04 ENCOUNTER — Other Ambulatory Visit (HOSPITAL_COMMUNITY): Payer: Self-pay | Admitting: Urology

## 2013-04-04 ENCOUNTER — Ambulatory Visit (HOSPITAL_COMMUNITY)
Admission: RE | Admit: 2013-04-04 | Discharge: 2013-04-04 | Disposition: A | Discharge status: Home / Self Care | Payer: BC Managed Care – PPO | Source: Ambulatory Visit | Attending: Urology | Admitting: Urology

## 2013-04-04 DIAGNOSIS — N200 Calculus of kidney: Secondary | ICD-10-CM

## 2013-04-06 ENCOUNTER — Encounter (HOSPITAL_COMMUNITY)
Admission: RE | Admit: 2013-04-06 | Discharge: 2013-04-06 | Disposition: A | Discharge status: Home / Self Care | Payer: BC Managed Care – PPO | Source: Ambulatory Visit | Attending: Urology | Admitting: Urology

## 2013-04-06 ENCOUNTER — Encounter (HOSPITAL_COMMUNITY): Payer: Self-pay

## 2013-04-06 ENCOUNTER — Other Ambulatory Visit: Payer: Self-pay

## 2013-04-06 ENCOUNTER — Encounter (HOSPITAL_COMMUNITY): Payer: Self-pay | Admitting: Pharmacy Technician

## 2013-04-06 HISTORY — DX: Personal history of urinary calculi: Z87.442

## 2013-04-06 HISTORY — DX: Nausea with vomiting, unspecified: R11.2

## 2013-04-06 HISTORY — DX: Unspecified osteoarthritis, unspecified site: M19.90

## 2013-04-06 HISTORY — DX: Other specified postprocedural states: Z98.890

## 2013-04-06 NOTE — Patient Instructions (Addendum)
Emily Hawkins  04/06/2013   Your procedure is scheduled on:  04/12/2013  Report to [facility] at  1230  PM.  Call this number if you have problems the morning of surgery: [phone]   Remember:   Do not eat food or drink liquids after midnight.   Take these medicines the morning of surgery with A SIP OF WATER: lisinopril, zofran   Do not wear jewelry, make-up or nail polish.  Do not wear lotions, powders, or perfumes.   Do not shave 48 hours prior to surgery. Men may shave face and neck.  Do not bring valuables to the hospital.  Contacts, dentures or bridgework may not be worn into surgery.  Leave suitcase in the car. After surgery it may be brought to your room.  For patients admitted to the hospital, checkout time is 11:00 AM the day of discharge.   Patients discharged the day of surgery will not be allowed to drive  home.  Name and phone number of your driver: family  Special Instructions: N/A   Please read over the following fact sheets that you were given: Pain Booklet, Coughing and Deep Breathing, Surgical Site Infection Prevention, Anesthesia Post-op Instructions and Care and Recovery After Surgery Lithotripsy for Kidney Stones WHAT ARE KIDNEY STONES? The kidneys filter blood for chemicals the body cannot use. These waste chemicals are eliminated in the urine. They are removed from the body. Under some conditions, these chemicals may become concentrated. When this happens, they form crystals in the urine. When these crystals build up and stick together, stones may form. When these stones block the flow of urine through the urinary tract, they may cause severe pain. The urinary tract is very sensitive to blockage and stretching by the stone. WHAT IS LITHOTRIPSY? Lithotripsy is a treatment that can sometimes help eliminate kidney stones and pain faster. A form of lithotripsy, also known as ESWL (extracorporeal shock wave lithotripsy), is a nonsurgical procedure that helps your  body rid itself of the kidney stone with a minimum amount of pain. EWSL is a method of crushing a kidney stone with shock waves. These shock waves pass through your body. They cause the kidney stones to crumble while still in the urinary tract. It is then easier for the smaller pieces of stone to pass in the urine. Lithotripsy usually takes about an hour. It is done in a hospital, a lithotripsy center, or a mobile unit. It usually does not require an overnight stay. Your caregiver will instruct you on preparation for the procedure. Your caregiver will tell you what to expect afterward. LET YOUR CAREGIVER KNOW ABOUT:  Allergies.  Medicines taken including herbs, eye drops, over the counter medicines (including aspirin, aleve, or motrin for treatment of inflammatory conditions) and creams.  Use of steroids (by mouth or creams).  Previous problems with anesthetics or novocaine.  Possibility of pregnancy, if this applies.  History of blood clots (thrombophlebitis).  History of bleeding or blood problems.  Previous surgery.  Other health problems. RISKS AND COMPLICATIONS Complications of lithotripsy are uncommon, but include the following:  Infection.  Bleeding of the kidney.  Bruising of the kidney or skin.  Obstruction of the ureter (the passageway from the kidney to the bladder).  Failure of the stone to fragment (break apart). PROCEDURE A stent (flexible tube with holes) may be placed in your ureter. The ureter is the tube that transports the urine from the kidneys to the bladder. Your caregiver may  place a stent before the procedure. This will help keep urine flowing from the kidney if the fragments of the stone block the ureter. You may receive an intravenous (IV) line to give you fluids and medicines. These medicines may help you relax or make you sleep. During the procedure, you will lie comfortably on a fluid-filled cushion or in a warm-water bath. After an x-ray or ultrasound  locates your stone, shock waves are aimed at the stone. If you are awake, you may feel a tapping sensation (feeling) as the shock waves pass through your body. If large stone particles remain after treatment, a second procedure may be necessary at a later date. For comfort during the test:  Relax as much as possible.  Try to remain still as much as possible.  Try to follow instructions to speed up the test.  Let your caregiver know if you are uncomfortable, anxious, or in pain. AFTER THE PROCEDURE  After surgery, you will be taken to the recovery area. A nurse will watch and check your progress. Once you're awake, stable, and taking fluids well, you will be allowed to go home as long as there are no problems. You may be prescribed antibiotics (medicines that kill germs) to help prevent infection. You may also be prescribed pain medicine if needed. In a week or two, your doctor may remove your stent, if you have one. Your caregiver will check to see whether or not stone particles remain. PASSING THE STONE It may take anywhere from a day to several weeks for the stone particles to leave your body. During this time, drink at least 8 to 12 eight ounce glasses of water every day. It is normal for your urine to be cloudy or slightly bloody for a few weeks following this procedure. You may even see small pieces of stone in your urine. A slight fever and some pain are also normal. Your caregiver may ask you to strain your urine to collect some stone particles for chemical analysis. If you find particles while straining the urine, save them. Analysis tells you and the caregiver what the stone is made of. Knowing this may help prevent future stones. PREVENTING FUTURE STONES  Drink about 8 to 12, eight-ounce glasses of water every day.  Follow the diet your caregiver recommends.  Take your prescribed medicine.  See your caregiver regularly for checkups. SEEK IMMEDIATE MEDICAL CARE IF:  You develop an  oral temperature above 102 F (38.9 C), or as your caregiver suggests.  Your pain is not relieved by medicine.  You develop nausea (feeling sick to your stomach) and vomiting.  You develop heavy bleeding.  You have difficulty urinating. Document Released: 11/27/2000 Document Revised: 02/22/2012 Document Reviewed: 09/21/2008 [facility] Patient Information 2013 [place], [place]. PATIENT INSTRUCTIONS POST-ANESTHESIA  IMMEDIATELY FOLLOWING SURGERY:  Do not drive or operate machinery for the first twenty four hours after surgery.  Do not make any important decisions for twenty four hours after surgery or while taking narcotic pain medications or sedatives.  If you develop intractable nausea and vomiting or a severe headache please notify your doctor immediately.  FOLLOW-UP:  Please make an appointment with your surgeon as instructed. You do not need to follow up with anesthesia unless specifically instructed to do so.  WOUND CARE INSTRUCTIONS (if applicable):  Keep a dry clean dressing on the anesthesia/puncture wound site if there is drainage.  Once the wound has quit draining you may leave it open to air.  Generally you should leave  the bandage intact for twenty four hours unless there is drainage.  If the epidural site drains for more than 36-48 hours please call the anesthesia department.  QUESTIONS?:  Please feel free to call your physician or the hospital operator if you have any questions, and they will be happy to assist you.

## 2013-04-12 ENCOUNTER — Encounter (HOSPITAL_COMMUNITY)
Admission: RE | Disposition: A | Discharge status: Home / Self Care | Payer: Self-pay | Source: Ambulatory Visit | Attending: Urology

## 2013-04-12 ENCOUNTER — Ambulatory Visit (HOSPITAL_COMMUNITY)
Admission: RE | Admit: 2013-04-12 | Discharge: 2013-04-12 | Disposition: A | Discharge status: Home / Self Care | Payer: BC Managed Care – PPO | Source: Ambulatory Visit | Attending: Urology | Admitting: Urology

## 2013-04-12 ENCOUNTER — Ambulatory Visit (HOSPITAL_COMMUNITY): Payer: BC Managed Care – PPO

## 2013-04-12 DIAGNOSIS — I10 Essential (primary) hypertension: Secondary | ICD-10-CM | POA: Insufficient documentation

## 2013-04-12 DIAGNOSIS — N200 Calculus of kidney: Secondary | ICD-10-CM | POA: Insufficient documentation

## 2013-04-12 DIAGNOSIS — Z01810 Encounter for preprocedural cardiovascular examination: Secondary | ICD-10-CM | POA: Insufficient documentation

## 2013-04-12 HISTORY — PX: EXTRACORPOREAL SHOCK WAVE LITHOTRIPSY: SHX1557

## 2013-04-12 SURGERY — LITHOTRIPSY, ESWL
Anesthesia: Moderate Sedation | Laterality: Left

## 2013-04-12 MED ORDER — DIPHENHYDRAMINE HCL 25 MG PO CAPS
25.0000 mg | ORAL_CAPSULE | Freq: Once | ORAL | Status: AC
  Administered 2013-04-12: 25 mg via ORAL

## 2013-04-12 MED ORDER — DIAZEPAM 5 MG PO TABS
ORAL_TABLET | ORAL | Status: AC
  Filled 2013-04-12: qty 2

## 2013-04-12 MED ORDER — DIAZEPAM 5 MG PO TABS
10.0000 mg | ORAL_TABLET | Freq: Once | ORAL | Status: AC
  Administered 2013-04-12: 10 mg via ORAL

## 2013-04-12 MED ORDER — DIPHENHYDRAMINE HCL 25 MG PO CAPS
ORAL_CAPSULE | ORAL | Status: AC
  Filled 2013-04-12: qty 1

## 2013-04-12 MED ORDER — SODIUM CHLORIDE 0.9 % IV SOLN
INTRAVENOUS | Status: DC
  Administered 2013-04-12: 14:00:00 via INTRAVENOUS

## 2013-04-12 SURGICAL SUPPLY — 3 items
CLOTH BEACON ORANGE TIMEOUT ST (SAFETY) IMPLANT
GOWN STRL REIN XL XLG (GOWN DISPOSABLE) IMPLANT
TOWEL OR 17X26 4PK STRL BLUE (TOWEL DISPOSABLE) IMPLANT
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
## 2013-04-12 NOTE — OR Nursing (Signed)
Pt. States that she thinks she might have passed the stone.  KUB done per order. Will inform Dr. Jerre Simon .

## 2013-04-13 ENCOUNTER — Encounter (HOSPITAL_COMMUNITY): Payer: Self-pay | Admitting: Urology

## 2013-04-17 ENCOUNTER — Ambulatory Visit (HOSPITAL_COMMUNITY)
Admission: RE | Admit: 2013-04-17 | Discharge: 2013-04-17 | Disposition: A | Discharge status: Home / Self Care | Payer: BC Managed Care – PPO | Source: Ambulatory Visit | Attending: Urology | Admitting: Urology

## 2013-04-17 ENCOUNTER — Encounter (HOSPITAL_COMMUNITY): Payer: Self-pay | Admitting: Emergency Medicine

## 2013-04-17 ENCOUNTER — Other Ambulatory Visit (HOSPITAL_COMMUNITY): Payer: Self-pay | Admitting: Urology

## 2013-04-17 ENCOUNTER — Emergency Department (HOSPITAL_COMMUNITY)
Admission: EM | Admit: 2013-04-17 | Discharge: 2013-04-17 | Disposition: A | Discharge status: Home / Self Care | Payer: BC Managed Care – PPO | Attending: Emergency Medicine | Admitting: Emergency Medicine

## 2013-04-17 DIAGNOSIS — N200 Calculus of kidney: Secondary | ICD-10-CM | POA: Insufficient documentation

## 2013-04-17 DIAGNOSIS — Z79899 Other long term (current) drug therapy: Secondary | ICD-10-CM | POA: Insufficient documentation

## 2013-04-17 DIAGNOSIS — I10 Essential (primary) hypertension: Secondary | ICD-10-CM | POA: Insufficient documentation

## 2013-04-17 DIAGNOSIS — Z8739 Personal history of other diseases of the musculoskeletal system and connective tissue: Secondary | ICD-10-CM | POA: Insufficient documentation

## 2013-04-17 DIAGNOSIS — R1032 Left lower quadrant pain: Secondary | ICD-10-CM | POA: Insufficient documentation

## 2013-04-17 MED ORDER — ONDANSETRON HCL 4 MG/2ML IJ SOLN
4.0000 mg | Freq: Once | INTRAMUSCULAR | Status: AC
  Administered 2013-04-17: 4 mg via INTRAVENOUS
  Filled 2013-04-17: qty 2

## 2013-04-17 MED ORDER — MORPHINE SULFATE 4 MG/ML IJ SOLN
2.0000 mg | Freq: Once | INTRAMUSCULAR | Status: AC
  Administered 2013-04-17: 2 mg via INTRAVENOUS
  Filled 2013-04-17: qty 1

## 2013-04-17 MED ORDER — KETOROLAC TROMETHAMINE 30 MG/ML IJ SOLN
30.0000 mg | Freq: Once | INTRAMUSCULAR | Status: AC
  Administered 2013-04-17: 30 mg via INTRAVENOUS
  Filled 2013-04-17: qty 1

## 2013-04-17 NOTE — ED Provider Notes (Signed)
History     CSN: [id number]  Arrival date & time 04/17/13  0117   First MD Initiated Contact with Patient 04/17/13 0145      Chief Complaint  Patient presents with  . Flank Pain    (Consider location/radiation/quality/duration/timing/severity/associated sxs/prior treatment) HPI Emily Hawkins is a 52 y.o. female who presents to the Emergency Department complaining of  Left flank pain and left sided abdominal pain that was not relieved with her pain medicine at home. She had lithotripsy done on Wednesday. Had no pain Friday and Saturday. She has taken her home mediicnes with no relief. Denies fever, chills, nausea, vomiting.   Urology Dr. Jerre Simon Past Medical History  Diagnosis Date  . Hypertension   . PONV (postoperative nausea and vomiting)   . Arthritis   . History of kidney stones     Past Surgical History  Procedure Laterality Date  . Abdominal surgery      lacerated liver repaired  . Nose surgery    . Tubal ligation    . Laser ablation      uterus  . Cholecystectomy  10 yrs ago  . Extracorporeal shock wave lithotripsy Left 04/12/2013    Procedure: EXTRACORPOREAL SHOCK WAVE LITHOTRIPSY (ESWL) LEFT RENAL CALCULUS;  Surgeon: Ky Barban, MD;  Location: [facility];  Service: Urology;  Laterality: Left;    No family history on file.  History  Substance Use Topics  . Smoking status: Never Smoker   . Smokeless tobacco: Not on file  . Alcohol Use: No    OB History   Grav Para Term Preterm Abortions TAB SAB Ect Mult Living                  Review of Systems  Constitutional: Negative for fever.       10 Systems reviewed and are negative for acute change except as noted in the HPI.  HENT: Negative for congestion.   Eyes: Negative for discharge and redness.  Respiratory: Negative for cough and shortness of breath.   Cardiovascular: Negative for chest pain.  Gastrointestinal: Positive for abdominal pain. Negative for vomiting.  Genitourinary: Positive for flank  pain.  Musculoskeletal: Negative for back pain.  Skin: Negative for rash.  Neurological: Negative for syncope, numbness and headaches.  Psychiatric/Behavioral:       No behavior change.    Allergies  Flagyl; Latex; Oxycodone; Penicillins; and Sulfa antibiotics  Home Medications   Current Outpatient Rx  Name  Route  Sig  Dispense  Refill  . HYDROcodone-acetaminophen (NORCO/VICODIN) 5-325 MG per tablet   Oral   Take 1 tablet by mouth every 6 (six) hours as needed for pain.         Marland Kitchen lovastatin (MEVACOR) 20 MG tablet   Oral   Take 20 mg by mouth at bedtime.         Marland Kitchen ibuprofen (ADVIL,MOTRIN) 200 MG tablet   Oral   Take 800 mg by mouth every 6 (six) hours as needed. For pain          . ketorolac (TORADOL) 10 MG tablet   Oral   Take 1 tablet (10 mg total) by mouth every 6 (six) hours as needed for pain.   20 tablet   0   . lisinopril (PRINIVIL,ZESTRIL) 10 MG tablet   Oral   Take 10 mg by mouth daily.           . Multiple Vitamin (MULITIVITAMIN WITH MINERALS) TABS   Oral   Take  1 tablet by mouth daily.         . ondansetron (ZOFRAN ODT) 4 MG disintegrating tablet      4mg  ODT q4 hours prn nausea/vomit   8 tablet   0   . Pseudoephedrine-Acetaminophen (SINUS RELIEF PO)   Oral   Take 1 tablet by mouth as needed. For congestion           There were no vitals taken for this visit.  Physical Exam  Nursing note and vitals reviewed. Constitutional: She appears well-developed and well-nourished.  Awake, alert, nontoxic appearance.  HENT:  Head: Normocephalic and atraumatic.  Eyes: EOM are normal. Pupils are equal, round, and reactive to light.  Neck: Normal range of motion. Neck supple.  Cardiovascular: Normal rate and intact distal pulses.   Pulmonary/Chest: Effort normal and breath sounds normal. She exhibits no tenderness.  Abdominal: Soft. Bowel sounds are normal. There is no tenderness. There is no rebound.  Genitourinary:  No cva tenderenss   Musculoskeletal: She exhibits no tenderness.  Baseline ROM, no obvious new focal weakness.  Neurological:  Mental status and motor strength appears baseline for patient and situation.  Skin: No rash noted.  Psychiatric: She has a normal mood and affect.    ED Course  Procedures (including critical care time)  Medications  ketorolac (TORADOL) 30 MG/ML injection 30 mg (30 mg Intravenous Given 04/17/13 0235)  ondansetron (ZOFRAN) injection 4 mg (4 mg Intravenous Given 04/17/13 0235)  morphine 4 MG/ML injection 2 mg (2 mg Intravenous Given 04/17/13 0234)    1. Kidney stone       MDM  Patient with known kidney stone who underwent lithotripsy on Wednesday and is having pain today that did not respond to her home medications. Given morphine, toradol and zofran with relief. Pt stable in ED with no significant deterioration in condition.The patient appears reasonably screened and/or stabilized for discharge and I doubt any other medical condition or other [facility] requiring further screening, evaluation, or treatment in the ED at this time prior to discharge.  MDM Reviewed: nursing note and vitals           Nicoletta Dress. Colon Branch, MD 04/17/13 [id number]

## 2013-04-17 NOTE — ED Notes (Signed)
Pt states she had a lithotripsy done at [facility] on Wednesday for a kidney stone to her left side, no complains of left sided flank pain that has gotten worse over the last several hours.

## 2013-06-01 ENCOUNTER — Other Ambulatory Visit (HOSPITAL_COMMUNITY): Payer: Self-pay | Admitting: Urology

## 2013-06-01 DIAGNOSIS — N200 Calculus of kidney: Secondary | ICD-10-CM

## 2013-06-07 ENCOUNTER — Ambulatory Visit (HOSPITAL_COMMUNITY)
Admission: RE | Admit: 2013-06-07 | Discharge: 2013-06-07 | Disposition: A | Discharge status: Home / Self Care | Payer: BC Managed Care – PPO | Source: Ambulatory Visit | Attending: Urology | Admitting: Urology

## 2013-06-07 DIAGNOSIS — N200 Calculus of kidney: Secondary | ICD-10-CM

## 2013-06-07 DIAGNOSIS — R9389 Abnormal findings on diagnostic imaging of other specified body structures: Secondary | ICD-10-CM | POA: Insufficient documentation

## 2013-08-31 ENCOUNTER — Other Ambulatory Visit (HOSPITAL_COMMUNITY): Payer: Self-pay | Admitting: Physician Assistant

## 2013-08-31 ENCOUNTER — Ambulatory Visit (HOSPITAL_COMMUNITY)
Admission: RE | Admit: 2013-08-31 | Discharge: 2013-08-31 | Disposition: A | Discharge status: Home / Self Care | Payer: BC Managed Care – PPO | Source: Ambulatory Visit | Attending: Physician Assistant | Admitting: Physician Assistant

## 2013-08-31 DIAGNOSIS — M25579 Pain in unspecified ankle and joints of unspecified foot: Secondary | ICD-10-CM | POA: Insufficient documentation

## 2013-08-31 DIAGNOSIS — M79609 Pain in unspecified limb: Secondary | ICD-10-CM

## 2013-08-31 DIAGNOSIS — M7730 Calcaneal spur, unspecified foot: Secondary | ICD-10-CM | POA: Insufficient documentation

## 2013-10-26 ENCOUNTER — Other Ambulatory Visit (HOSPITAL_COMMUNITY): Payer: Self-pay | Admitting: Family Medicine

## 2013-10-26 ENCOUNTER — Ambulatory Visit (HOSPITAL_COMMUNITY)
Admission: RE | Admit: 2013-10-26 | Discharge: 2013-10-26 | Disposition: A | Discharge status: Home / Self Care | Payer: BC Managed Care – PPO | Source: Ambulatory Visit | Attending: Family Medicine | Admitting: Family Medicine

## 2013-10-26 DIAGNOSIS — M79609 Pain in unspecified limb: Secondary | ICD-10-CM | POA: Insufficient documentation

## 2013-10-26 DIAGNOSIS — M25571 Pain in right ankle and joints of right foot: Secondary | ICD-10-CM

## 2014-05-02 ENCOUNTER — Other Ambulatory Visit (HOSPITAL_COMMUNITY): Payer: Self-pay | Admitting: Physician Assistant

## 2014-05-02 ENCOUNTER — Ambulatory Visit (HOSPITAL_COMMUNITY)
Admission: RE | Admit: 2014-05-02 | Discharge: 2014-05-02 | Disposition: A | Discharge status: Home / Self Care | Payer: BC Managed Care – PPO | Source: Ambulatory Visit | Attending: Physician Assistant | Admitting: Physician Assistant

## 2014-05-02 ENCOUNTER — Encounter (HOSPITAL_COMMUNITY): Payer: Self-pay

## 2014-05-02 DIAGNOSIS — R55 Syncope and collapse: Secondary | ICD-10-CM

## 2014-05-02 DIAGNOSIS — G4489 Other headache syndrome: Secondary | ICD-10-CM

## 2015-06-26 ENCOUNTER — Other Ambulatory Visit (HOSPITAL_COMMUNITY): Payer: Self-pay | Admitting: Family Medicine

## 2015-06-26 DIAGNOSIS — M79644 Pain in right finger(s): Secondary | ICD-10-CM

## 2015-06-27 ENCOUNTER — Ambulatory Visit (HOSPITAL_COMMUNITY)
Admission: RE | Admit: 2015-06-27 | Discharge: 2015-06-27 | Disposition: A | Discharge status: Home / Self Care | Payer: BLUE CROSS/BLUE SHIELD | Source: Ambulatory Visit | Attending: Family Medicine | Admitting: Family Medicine

## 2015-06-27 DIAGNOSIS — M79644 Pain in right finger(s): Secondary | ICD-10-CM | POA: Insufficient documentation

## 2015-08-29 ENCOUNTER — Other Ambulatory Visit (HOSPITAL_COMMUNITY): Payer: Self-pay | Admitting: Family Medicine

## 2015-08-29 DIAGNOSIS — Z1231 Encounter for screening mammogram for malignant neoplasm of breast: Secondary | ICD-10-CM

## 2015-09-16 ENCOUNTER — Ambulatory Visit (HOSPITAL_COMMUNITY)
Admission: RE | Admit: 2015-09-16 | Discharge: 2015-09-16 | Disposition: A | Discharge status: Home / Self Care | Payer: BLUE CROSS/BLUE SHIELD | Source: Ambulatory Visit | Attending: Family Medicine | Admitting: Family Medicine

## 2015-09-16 DIAGNOSIS — Z1231 Encounter for screening mammogram for malignant neoplasm of breast: Secondary | ICD-10-CM | POA: Diagnosis present

## 2015-09-19 ENCOUNTER — Other Ambulatory Visit: Payer: Self-pay | Admitting: Family Medicine

## 2015-09-19 DIAGNOSIS — R928 Other abnormal and inconclusive findings on diagnostic imaging of breast: Secondary | ICD-10-CM

## 2015-10-01 ENCOUNTER — Ambulatory Visit (HOSPITAL_COMMUNITY)
Admission: RE | Admit: 2015-10-01 | Discharge: 2015-10-01 | Disposition: A | Discharge status: Home / Self Care | Payer: BLUE CROSS/BLUE SHIELD | Source: Ambulatory Visit | Attending: Family Medicine | Admitting: Family Medicine

## 2015-10-01 DIAGNOSIS — N6002 Solitary cyst of left breast: Secondary | ICD-10-CM | POA: Diagnosis present

## 2015-10-01 DIAGNOSIS — R928 Other abnormal and inconclusive findings on diagnostic imaging of breast: Secondary | ICD-10-CM

## 2016-01-24 ENCOUNTER — Emergency Department (HOSPITAL_COMMUNITY): Payer: BLUE CROSS/BLUE SHIELD

## 2016-01-24 ENCOUNTER — Emergency Department (HOSPITAL_COMMUNITY)
Admission: EM | Admit: 2016-01-24 | Discharge: 2016-01-24 | Disposition: A | Discharge status: Home / Self Care | Payer: BLUE CROSS/BLUE SHIELD | Attending: Emergency Medicine | Admitting: Emergency Medicine

## 2016-01-24 ENCOUNTER — Encounter (HOSPITAL_COMMUNITY): Payer: Self-pay | Admitting: Emergency Medicine

## 2016-01-24 DIAGNOSIS — Z9889 Other specified postprocedural states: Secondary | ICD-10-CM | POA: Diagnosis not present

## 2016-01-24 DIAGNOSIS — Z87442 Personal history of urinary calculi: Secondary | ICD-10-CM | POA: Insufficient documentation

## 2016-01-24 DIAGNOSIS — Z91040 Latex allergy status: Secondary | ICD-10-CM | POA: Diagnosis not present

## 2016-01-24 DIAGNOSIS — F419 Anxiety disorder, unspecified: Secondary | ICD-10-CM | POA: Diagnosis not present

## 2016-01-24 DIAGNOSIS — R42 Dizziness and giddiness: Secondary | ICD-10-CM | POA: Diagnosis present

## 2016-01-24 DIAGNOSIS — I10 Essential (primary) hypertension: Secondary | ICD-10-CM | POA: Diagnosis not present

## 2016-01-24 DIAGNOSIS — M1990 Unspecified osteoarthritis, unspecified site: Secondary | ICD-10-CM | POA: Insufficient documentation

## 2016-01-24 DIAGNOSIS — R0602 Shortness of breath: Secondary | ICD-10-CM | POA: Diagnosis not present

## 2016-01-24 DIAGNOSIS — Z79899 Other long term (current) drug therapy: Secondary | ICD-10-CM | POA: Insufficient documentation

## 2016-01-24 DIAGNOSIS — R51 Headache: Secondary | ICD-10-CM | POA: Insufficient documentation

## 2016-01-24 DIAGNOSIS — R110 Nausea: Secondary | ICD-10-CM | POA: Insufficient documentation

## 2016-01-24 DIAGNOSIS — Z880 Allergy status to penicillin: Secondary | ICD-10-CM | POA: Diagnosis not present

## 2016-01-24 HISTORY — DX: Migraine, unspecified, not intractable, without status migrainosus: G43.909

## 2016-01-24 HISTORY — DX: Laceration of liver, unspecified degree, initial encounter: S36.113A

## 2016-01-24 LAB — CBC
HCT: 45.4 % (ref 36.0–46.0)
Hemoglobin: 15 g/dL (ref 12.0–15.0)
MCH: 30.2 pg (ref 26.0–34.0)
MCHC: 33 g/dL (ref 30.0–36.0)
MCV: 91.3 fL (ref 78.0–100.0)
Platelets: 178 10*3/uL (ref 150–400)
RBC: 4.97 MIL/uL (ref 3.87–5.11)
RDW: 13.6 % (ref 11.5–15.5)
WBC: 9.6 10*3/uL (ref 4.0–10.5)

## 2016-01-24 LAB — URINALYSIS, ROUTINE W REFLEX MICROSCOPIC
Bilirubin Urine: NEGATIVE
Glucose, UA: NEGATIVE mg/dL
Hgb urine dipstick: NEGATIVE
Leukocytes, UA: NEGATIVE
Nitrite: NEGATIVE
Protein, ur: NEGATIVE mg/dL
Specific Gravity, Urine: 1.01 (ref 1.005–1.030)
pH: 6.5 (ref 5.0–8.0)

## 2016-01-24 LAB — BASIC METABOLIC PANEL
Anion gap: 9 (ref 5–15)
BUN: 8 mg/dL (ref 6–20)
CO2: 25 mmol/L (ref 22–32)
Calcium: 9.3 mg/dL (ref 8.9–10.3)
Chloride: 104 mmol/L (ref 101–111)
Creatinine, Ser: 0.59 mg/dL (ref 0.44–1.00)
GFR calc Af Amer: >60 mL/min (ref >60)
GFR calc non Af Amer: >60 mL/min (ref >60)
Glucose, Bld: 90 mg/dL (ref 65–99)
Potassium: 3.9 mmol/L (ref 3.5–5.1)
Sodium: 138 mmol/L (ref 135–145)

## 2016-01-24 MED ORDER — MECLIZINE HCL 25 MG PO TABS: 25.0000 mg | ORAL_TABLET | Freq: Three times a day (TID) | ORAL | Status: DC | PRN

## 2016-01-24 MED ORDER — HYDROCODONE-ACETAMINOPHEN 7.5-325 MG/15ML PO SOLN
10.0000 mL | Freq: Once | ORAL | Status: AC
  Administered 2016-01-24: 10 mL via ORAL
  Filled 2016-01-24: qty 15

## 2016-01-24 MED ORDER — ONDANSETRON 4 MG PO TBDP
4.0000 mg | ORAL_TABLET | Freq: Once | ORAL | Status: AC
  Administered 2016-01-24: 4 mg via ORAL
  Filled 2016-01-24: qty 1

## 2016-01-24 MED ORDER — MECLIZINE HCL 12.5 MG PO TABS
25.0000 mg | ORAL_TABLET | Freq: Once | ORAL | Status: AC
  Administered 2016-01-24: 25 mg via ORAL
  Filled 2016-01-24: qty 2

## 2016-01-24 MED ORDER — OXYMETAZOLINE HCL 0.05 % NA SOLN
2.0000 | Freq: Once | NASAL | Status: AC
  Administered 2016-01-24: 2 via NASAL
  Filled 2016-01-24: qty 15
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
## 2016-01-24 NOTE — ED Notes (Signed)
Patient sent from Urgent Care states she was at work when she became lightheaded, short of breath, and diaphoretic. Was also complaining of headache but has since resolved.

## 2016-01-24 NOTE — Discharge Instructions (Signed)
Please use afrin every 8 hours for 5 days only. Use antivert three times daily for lightheadedness or dizziness. Please increase fluids. Please change positions slowly. Vertigo Vertigo means that you feel like you are moving when you are not. Vertigo can also make you feel like things around you are moving when they are not. This feeling can come and go at any time. Vertigo often goes away on its own. HOME CARE  Avoid making fast movements.  Avoid driving.  Avoid using heavy machinery.  Avoid doing any task or activity that might cause danger to you or other people if you would have a vertigo attack while you are doing it.  Sit down right away if you feel dizzy or have trouble with your balance.  Take over-the-counter and prescription medicines only as told by your doctor.  Follow instructions from your doctor about which positions or movements you should avoid.  Drink enough fluid to keep your pee (urine) clear or pale yellow.  Keep all follow-up visits as told by your doctor. This is important. GET HELP IF:  Medicine does not help your vertigo.  You have a fever.  Your problems get worse or you have new symptoms.  Your family or friends see changes in your behavior.  You feel sick to your stomach (nauseous) or you throw up (vomit).  You have a "pins and needles" feeling or you are numb in part of your body. GET HELP RIGHT AWAY IF:  You have trouble moving or talking.  You are always dizzy.  You pass out (faint).  You get very bad headaches.  You feel weak or have trouble using your hands, arms, or legs.  You have changes in your hearing.  You have changes in your seeing (vision).  You get a stiff neck.  Bright light starts to bother you.   This information is not intended to replace advice given to you by your health care provider. Make sure you discuss any questions you have with your health care provider.   Document Released: 09/08/2008 Document Revised:  08/21/2015 Document Reviewed: 03/25/2015 Elsevier Interactive Patient Education [employer].

## 2016-03-09 ENCOUNTER — Emergency Department (HOSPITAL_COMMUNITY): Payer: No Typology Code available for payment source

## 2016-03-09 ENCOUNTER — Emergency Department (HOSPITAL_COMMUNITY)
Admission: EM | Admit: 2016-03-09 | Discharge: 2016-03-09 | Disposition: A | Discharge status: Home / Self Care | Payer: No Typology Code available for payment source | Attending: Emergency Medicine | Admitting: Emergency Medicine

## 2016-03-09 ENCOUNTER — Encounter (HOSPITAL_COMMUNITY): Payer: Self-pay | Admitting: Emergency Medicine

## 2016-03-09 DIAGNOSIS — S63502A Unspecified sprain of left wrist, initial encounter: Secondary | ICD-10-CM | POA: Insufficient documentation

## 2016-03-09 DIAGNOSIS — S3992XA Unspecified injury of lower back, initial encounter: Secondary | ICD-10-CM

## 2016-03-09 DIAGNOSIS — S3091XA Unspecified superficial injury of lower back and pelvis, initial encounter: Secondary | ICD-10-CM | POA: Diagnosis not present

## 2016-03-09 DIAGNOSIS — Y939 Activity, unspecified: Secondary | ICD-10-CM | POA: Insufficient documentation

## 2016-03-09 DIAGNOSIS — S0093XA Contusion of unspecified part of head, initial encounter: Secondary | ICD-10-CM | POA: Insufficient documentation

## 2016-03-09 DIAGNOSIS — Y999 Unspecified external cause status: Secondary | ICD-10-CM | POA: Insufficient documentation

## 2016-03-09 DIAGNOSIS — Y92410 Unspecified street and highway as the place of occurrence of the external cause: Secondary | ICD-10-CM | POA: Diagnosis not present

## 2016-03-09 DIAGNOSIS — I10 Essential (primary) hypertension: Secondary | ICD-10-CM | POA: Diagnosis not present

## 2016-03-09 DIAGNOSIS — S0990XA Unspecified injury of head, initial encounter: Secondary | ICD-10-CM | POA: Diagnosis present

## 2016-03-09 HISTORY — DX: Dizziness and giddiness: R42

## 2016-03-09 MED ORDER — IBUPROFEN 400 MG PO TABS
600.0000 mg | ORAL_TABLET | Freq: Once | ORAL | Status: AC
  Administered 2016-03-09: 600 mg via ORAL
  Filled 2016-03-09: qty 2

## 2016-03-09 MED ORDER — DICLOFENAC SODIUM 50 MG PO TBEC: 50.0000 mg | DELAYED_RELEASE_TABLET | Freq: Two times a day (BID) | ORAL | Status: DC

## 2016-03-09 MED ORDER — HYDROCODONE-ACETAMINOPHEN 5-325 MG PO TABS: 1.0000 | ORAL_TABLET | ORAL | Status: DC | PRN

## 2016-03-09 MED ORDER — HYDROCODONE-ACETAMINOPHEN 5-325 MG PO TABS
1.0000 | ORAL_TABLET | Freq: Once | ORAL | Status: DC
  Filled 2016-03-09: qty 1
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
## 2016-03-27 DIAGNOSIS — Z1389 Encounter for screening for other disorder: Secondary | ICD-10-CM | POA: Diagnosis not present

## 2016-03-27 DIAGNOSIS — E663 Overweight: Secondary | ICD-10-CM | POA: Diagnosis not present

## 2016-03-27 DIAGNOSIS — M545 Low back pain: Secondary | ICD-10-CM | POA: Diagnosis not present

## 2016-03-27 DIAGNOSIS — Z6828 Body mass index (BMI) 28.0-28.9, adult: Secondary | ICD-10-CM | POA: Diagnosis not present

## 2016-04-21 ENCOUNTER — Ambulatory Visit (INDEPENDENT_AMBULATORY_CARE_PROVIDER_SITE_OTHER): Payer: BLUE CROSS/BLUE SHIELD | Admitting: Urology

## 2016-04-21 DIAGNOSIS — N200 Calculus of kidney: Secondary | ICD-10-CM | POA: Diagnosis not present

## 2016-04-24 ENCOUNTER — Other Ambulatory Visit: Payer: Self-pay | Admitting: Urology

## 2016-04-24 DIAGNOSIS — N200 Calculus of kidney: Secondary | ICD-10-CM

## 2016-04-30 ENCOUNTER — Ambulatory Visit (HOSPITAL_COMMUNITY)
Admission: RE | Admit: 2016-04-30 | Discharge: 2016-04-30 | Disposition: A | Discharge status: Home / Self Care | Payer: BLUE CROSS/BLUE SHIELD | Source: Ambulatory Visit | Attending: Urology | Admitting: Urology

## 2016-04-30 DIAGNOSIS — Z09 Encounter for follow-up examination after completed treatment for conditions other than malignant neoplasm: Secondary | ICD-10-CM | POA: Diagnosis not present

## 2016-04-30 DIAGNOSIS — Z87442 Personal history of urinary calculi: Secondary | ICD-10-CM | POA: Diagnosis not present

## 2016-04-30 DIAGNOSIS — D259 Leiomyoma of uterus, unspecified: Secondary | ICD-10-CM | POA: Insufficient documentation

## 2016-04-30 DIAGNOSIS — N200 Calculus of kidney: Secondary | ICD-10-CM | POA: Diagnosis present

## 2016-04-30 DIAGNOSIS — N852 Hypertrophy of uterus: Secondary | ICD-10-CM | POA: Insufficient documentation

## 2016-05-12 DIAGNOSIS — M545 Low back pain: Secondary | ICD-10-CM | POA: Diagnosis not present

## 2016-05-12 DIAGNOSIS — M9901 Segmental and somatic dysfunction of cervical region: Secondary | ICD-10-CM | POA: Diagnosis not present

## 2016-05-12 DIAGNOSIS — M9902 Segmental and somatic dysfunction of thoracic region: Secondary | ICD-10-CM | POA: Diagnosis not present

## 2016-05-12 DIAGNOSIS — M9903 Segmental and somatic dysfunction of lumbar region: Secondary | ICD-10-CM | POA: Diagnosis not present

## 2016-06-08 DIAGNOSIS — M9902 Segmental and somatic dysfunction of thoracic region: Secondary | ICD-10-CM | POA: Diagnosis not present

## 2016-06-08 DIAGNOSIS — M9901 Segmental and somatic dysfunction of cervical region: Secondary | ICD-10-CM | POA: Diagnosis not present

## 2016-06-08 DIAGNOSIS — M545 Low back pain: Secondary | ICD-10-CM | POA: Diagnosis not present

## 2016-06-08 DIAGNOSIS — M9903 Segmental and somatic dysfunction of lumbar region: Secondary | ICD-10-CM | POA: Diagnosis not present

## 2016-06-12 DIAGNOSIS — I10 Essential (primary) hypertension: Secondary | ICD-10-CM | POA: Diagnosis not present

## 2016-06-12 DIAGNOSIS — Z1389 Encounter for screening for other disorder: Secondary | ICD-10-CM | POA: Diagnosis not present

## 2016-06-12 DIAGNOSIS — Z6829 Body mass index (BMI) 29.0-29.9, adult: Secondary | ICD-10-CM | POA: Diagnosis not present

## 2016-06-17 DIAGNOSIS — Z0000 Encounter for general adult medical examination without abnormal findings: Secondary | ICD-10-CM | POA: Diagnosis not present

## 2016-09-08 ENCOUNTER — Other Ambulatory Visit (HOSPITAL_COMMUNITY): Payer: Self-pay | Admitting: Family Medicine

## 2016-09-08 DIAGNOSIS — Z1231 Encounter for screening mammogram for malignant neoplasm of breast: Secondary | ICD-10-CM

## 2016-09-22 DIAGNOSIS — M545 Low back pain: Secondary | ICD-10-CM | POA: Diagnosis not present

## 2016-09-22 DIAGNOSIS — M9903 Segmental and somatic dysfunction of lumbar region: Secondary | ICD-10-CM | POA: Diagnosis not present

## 2016-09-22 DIAGNOSIS — M542 Cervicalgia: Secondary | ICD-10-CM | POA: Diagnosis not present

## 2016-09-22 DIAGNOSIS — M9901 Segmental and somatic dysfunction of cervical region: Secondary | ICD-10-CM | POA: Diagnosis not present

## 2016-10-02 DIAGNOSIS — E782 Mixed hyperlipidemia: Secondary | ICD-10-CM | POA: Diagnosis not present

## 2016-10-02 DIAGNOSIS — I10 Essential (primary) hypertension: Secondary | ICD-10-CM | POA: Diagnosis not present

## 2016-10-02 DIAGNOSIS — Z1389 Encounter for screening for other disorder: Secondary | ICD-10-CM | POA: Diagnosis not present

## 2016-10-02 DIAGNOSIS — F330 Major depressive disorder, recurrent, mild: Secondary | ICD-10-CM | POA: Diagnosis not present

## 2016-10-02 DIAGNOSIS — Z6829 Body mass index (BMI) 29.0-29.9, adult: Secondary | ICD-10-CM | POA: Diagnosis not present

## 2016-10-05 ENCOUNTER — Ambulatory Visit (HOSPITAL_COMMUNITY)
Admission: RE | Admit: 2016-10-05 | Discharge: 2016-10-05 | Disposition: A | Discharge status: Home / Self Care | Payer: BLUE CROSS/BLUE SHIELD | Source: Ambulatory Visit | Attending: Family Medicine | Admitting: Family Medicine

## 2016-10-05 DIAGNOSIS — Z1231 Encounter for screening mammogram for malignant neoplasm of breast: Secondary | ICD-10-CM | POA: Insufficient documentation

## 2016-12-23 DIAGNOSIS — Z6827 Body mass index (BMI) 27.0-27.9, adult: Secondary | ICD-10-CM | POA: Diagnosis not present

## 2016-12-23 DIAGNOSIS — E663 Overweight: Secondary | ICD-10-CM | POA: Diagnosis not present

## 2016-12-23 DIAGNOSIS — Z1389 Encounter for screening for other disorder: Secondary | ICD-10-CM | POA: Diagnosis not present

## 2016-12-23 DIAGNOSIS — Z0000 Encounter for general adult medical examination without abnormal findings: Secondary | ICD-10-CM | POA: Diagnosis not present

## 2016-12-23 DIAGNOSIS — F329 Major depressive disorder, single episode, unspecified: Secondary | ICD-10-CM | POA: Diagnosis not present

## 2016-12-23 DIAGNOSIS — I10 Essential (primary) hypertension: Secondary | ICD-10-CM | POA: Diagnosis not present

## 2016-12-28 DIAGNOSIS — I781 Nevus, non-neoplastic: Secondary | ICD-10-CM | POA: Diagnosis not present

## 2016-12-28 DIAGNOSIS — Z6826 Body mass index (BMI) 26.0-26.9, adult: Secondary | ICD-10-CM | POA: Diagnosis not present

## 2016-12-28 DIAGNOSIS — E663 Overweight: Secondary | ICD-10-CM | POA: Diagnosis not present

## 2017-01-04 ENCOUNTER — Encounter: Payer: Self-pay | Admitting: Obstetrics & Gynecology

## 2017-01-14 DIAGNOSIS — D485 Neoplasm of uncertain behavior of skin: Secondary | ICD-10-CM | POA: Diagnosis not present

## 2017-01-20 DIAGNOSIS — M542 Cervicalgia: Secondary | ICD-10-CM | POA: Diagnosis not present

## 2017-01-20 DIAGNOSIS — M545 Low back pain: Secondary | ICD-10-CM | POA: Diagnosis not present

## 2017-01-20 DIAGNOSIS — M9903 Segmental and somatic dysfunction of lumbar region: Secondary | ICD-10-CM | POA: Diagnosis not present

## 2017-01-20 DIAGNOSIS — M9901 Segmental and somatic dysfunction of cervical region: Secondary | ICD-10-CM | POA: Diagnosis not present

## 2017-01-25 DIAGNOSIS — M545 Low back pain: Secondary | ICD-10-CM | POA: Diagnosis not present

## 2017-01-25 DIAGNOSIS — M9903 Segmental and somatic dysfunction of lumbar region: Secondary | ICD-10-CM | POA: Diagnosis not present

## 2017-01-25 DIAGNOSIS — M542 Cervicalgia: Secondary | ICD-10-CM | POA: Diagnosis not present

## 2017-01-25 DIAGNOSIS — M9901 Segmental and somatic dysfunction of cervical region: Secondary | ICD-10-CM | POA: Diagnosis not present

## 2017-01-29 DIAGNOSIS — M542 Cervicalgia: Secondary | ICD-10-CM | POA: Diagnosis not present

## 2017-01-29 DIAGNOSIS — M9903 Segmental and somatic dysfunction of lumbar region: Secondary | ICD-10-CM | POA: Diagnosis not present

## 2017-01-29 DIAGNOSIS — M545 Low back pain: Secondary | ICD-10-CM | POA: Diagnosis not present

## 2017-01-29 DIAGNOSIS — M9901 Segmental and somatic dysfunction of cervical region: Secondary | ICD-10-CM | POA: Diagnosis not present

## 2017-02-03 DIAGNOSIS — M9903 Segmental and somatic dysfunction of lumbar region: Secondary | ICD-10-CM | POA: Diagnosis not present

## 2017-02-03 DIAGNOSIS — M9901 Segmental and somatic dysfunction of cervical region: Secondary | ICD-10-CM | POA: Diagnosis not present

## 2017-02-03 DIAGNOSIS — M545 Low back pain: Secondary | ICD-10-CM | POA: Diagnosis not present

## 2017-02-03 DIAGNOSIS — M542 Cervicalgia: Secondary | ICD-10-CM | POA: Diagnosis not present

## 2017-03-11 DIAGNOSIS — M9903 Segmental and somatic dysfunction of lumbar region: Secondary | ICD-10-CM | POA: Diagnosis not present

## 2017-03-11 DIAGNOSIS — M542 Cervicalgia: Secondary | ICD-10-CM | POA: Diagnosis not present

## 2017-03-11 DIAGNOSIS — M545 Low back pain: Secondary | ICD-10-CM | POA: Diagnosis not present

## 2017-03-11 DIAGNOSIS — M9901 Segmental and somatic dysfunction of cervical region: Secondary | ICD-10-CM | POA: Diagnosis not present

## 2017-03-22 ENCOUNTER — Ambulatory Visit (INDEPENDENT_AMBULATORY_CARE_PROVIDER_SITE_OTHER): Payer: BLUE CROSS/BLUE SHIELD | Admitting: Orthopedic Surgery

## 2017-03-22 ENCOUNTER — Encounter: Payer: Self-pay | Admitting: Orthopedic Surgery

## 2017-03-22 VITALS — BP 162/98 | HR 72 | Ht 61.0 in | Wt 138.0 lb

## 2017-03-22 DIAGNOSIS — S86112A Strain of other muscle(s) and tendon(s) of posterior muscle group at lower leg level, left leg, initial encounter: Secondary | ICD-10-CM | POA: Diagnosis not present

## 2017-03-22 DIAGNOSIS — E663 Overweight: Secondary | ICD-10-CM | POA: Diagnosis not present

## 2017-03-22 DIAGNOSIS — Z6826 Body mass index (BMI) 26.0-26.9, adult: Secondary | ICD-10-CM | POA: Diagnosis not present

## 2017-03-22 DIAGNOSIS — M79605 Pain in left leg: Secondary | ICD-10-CM | POA: Diagnosis not present

## 2017-03-22 DIAGNOSIS — Z1389 Encounter for screening for other disorder: Secondary | ICD-10-CM | POA: Diagnosis not present
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
## 2017-03-22 NOTE — Patient Instructions (Addendum)
CALF TEAR  OUT OF WORK FOR 2 WEEK

## 2017-03-26 ENCOUNTER — Telehealth: Payer: Self-pay | Admitting: Orthopedic Surgery

## 2017-03-26 NOTE — Telephone Encounter (Signed)
ROUTING TO DR HARRISON TO ADVISE 

## 2017-03-26 NOTE — Telephone Encounter (Signed)
Patient called wanting to ask some questions. They are as follows: 1. How long does she need to stay on crutches? 2. When can she start putting some weight on her left leg? 3. Does she need a ACE bandage on her leg? 4. How many times a day does she need to use her heating pad on her leg?  Please call and advise

## 2017-03-29 NOTE — Telephone Encounter (Signed)
Pt called 4/13 and is asking several ?.  Can you please call her back today.  I told her you were working on the hall with the doctor today and would call as soon as you can.

## 2017-03-29 NOTE — Telephone Encounter (Signed)
Awaiting your response

## 2017-04-02 NOTE — Telephone Encounter (Signed)
Dr Aline Brochure spoke with this patient last week

## 2017-07-21 DIAGNOSIS — Z6828 Body mass index (BMI) 28.0-28.9, adult: Secondary | ICD-10-CM | POA: Diagnosis not present

## 2017-07-21 DIAGNOSIS — E663 Overweight: Secondary | ICD-10-CM | POA: Diagnosis not present

## 2017-07-21 DIAGNOSIS — F4320 Adjustment disorder, unspecified: Secondary | ICD-10-CM | POA: Diagnosis not present

## 2017-07-21 DIAGNOSIS — Z1389 Encounter for screening for other disorder: Secondary | ICD-10-CM | POA: Diagnosis not present

## 2017-07-21 DIAGNOSIS — I10 Essential (primary) hypertension: Secondary | ICD-10-CM | POA: Diagnosis not present

## 2017-07-21 DIAGNOSIS — E782 Mixed hyperlipidemia: Secondary | ICD-10-CM | POA: Diagnosis not present

## 2017-08-05 DIAGNOSIS — R0789 Other chest pain: Secondary | ICD-10-CM | POA: Diagnosis not present

## 2017-08-05 DIAGNOSIS — E663 Overweight: Secondary | ICD-10-CM | POA: Diagnosis not present

## 2017-08-05 DIAGNOSIS — N200 Calculus of kidney: Secondary | ICD-10-CM | POA: Diagnosis not present

## 2017-08-05 DIAGNOSIS — Z6827 Body mass index (BMI) 27.0-27.9, adult: Secondary | ICD-10-CM | POA: Diagnosis not present

## 2017-08-05 DIAGNOSIS — Z1389 Encounter for screening for other disorder: Secondary | ICD-10-CM | POA: Diagnosis not present

## 2017-09-01 DIAGNOSIS — F329 Major depressive disorder, single episode, unspecified: Secondary | ICD-10-CM | POA: Diagnosis not present

## 2017-09-01 DIAGNOSIS — F330 Major depressive disorder, recurrent, mild: Secondary | ICD-10-CM | POA: Diagnosis not present

## 2017-09-01 DIAGNOSIS — G43901 Migraine, unspecified, not intractable, with status migrainosus: Secondary | ICD-10-CM | POA: Diagnosis not present

## 2017-09-01 DIAGNOSIS — Z6828 Body mass index (BMI) 28.0-28.9, adult: Secondary | ICD-10-CM | POA: Diagnosis not present

## 2017-09-01 DIAGNOSIS — Z1389 Encounter for screening for other disorder: Secondary | ICD-10-CM | POA: Diagnosis not present

## 2017-09-01 DIAGNOSIS — E663 Overweight: Secondary | ICD-10-CM | POA: Diagnosis not present

## 2017-09-08 ENCOUNTER — Other Ambulatory Visit (HOSPITAL_COMMUNITY): Payer: Self-pay | Admitting: Family Medicine

## 2017-09-08 DIAGNOSIS — Z1231 Encounter for screening mammogram for malignant neoplasm of breast: Secondary | ICD-10-CM

## 2017-09-15 ENCOUNTER — Encounter: Payer: BLUE CROSS/BLUE SHIELD | Attending: Internal Medicine | Admitting: Nutrition

## 2017-09-15 VITALS — Ht 61.0 in | Wt 150.0 lb

## 2017-09-15 DIAGNOSIS — E663 Overweight: Secondary | ICD-10-CM | POA: Insufficient documentation

## 2017-09-15 DIAGNOSIS — Z713 Dietary counseling and surveillance: Secondary | ICD-10-CM | POA: Diagnosis not present

## 2017-09-15 DIAGNOSIS — E669 Obesity, unspecified: Secondary | ICD-10-CM

## 2017-09-15 DIAGNOSIS — I10 Essential (primary) hypertension: Secondary | ICD-10-CM

## 2017-09-15 NOTE — Progress Notes (Signed)
  Medical Nutrition Therapy:  Appt start time: 1500 end time:  1630.   Assessment:  Primary concerns today: Overweight. She lives by herself. Eats most meals at home. Most foods are grilled or baked. Eats out once week. She use to eat out a lot with a boyfriend but is no longer dong that.  Weighed 125-130 lbs about 3 years. Has felt like she has a lot stress and admits to being a stress eater. Not physically active right now. Has had some back issues. Current diet is excessive in calories, fat and low in fresh fruits, whole grains and high fiber foods and low carb vegetables. Ready to changes eating habits and behaviors to obtain weight loss and be healthier.  Preferred Learning Style:   No preference indicated   Learning Readiness:   Ready  Change in progress   MEDICATIONS: see list   DIETARY INTAKE:  24-hr recall:  B ( AM):  Coffee, apple or banana Snk ( AM):  Cream of wheat or oatmeal, water Grapes L ( PM): BBQ chicken, creamed potatoes, water Snk ( PM):  D ( PM): Ham and bacon croissant, water Snk ( PM): pork skins. Beverages: Water  Usual physical activity: Walking at work, home  Estimated energy needs: 1200 calories 135  g carbohydrates 90 g protein 33 g fat  Progress Towards Goal(s):  In progress.   Nutritional Diagnosis:  NB-1.1 Food and nutrition-related knowledge deficit As related to Overweight.  As evidenced by BMI of 28.     Intervention: Nutrition and Heart Healthy Weight loss  education provided on My Plate, CHO counting, meal planning, portion sizes, timing of meals, avoiding snacks between meals. Need to drink water only and no diet sodas, juice, tea or SF beverages.  Benefits of 150 minutes of exercise per week.   Goals 1. Follow My Plate Method  Eat 2-3 carb choices per meal 2. Increase fresh fruits and vegetables. 3. Exercise 30-60 minutes 3 4. Avoid snacks between meals. Lose 1 lbs per week   Teaching Method Utilized:   Visual Auditory Hands on  Handouts given during visit include:  The Plate Method   Meal Plan Card  Diabetes Instructions   Barriers to learning/adherence to lifestyle change: none  Demonstrated degree of understanding via:  Teach Back   Monitoring/Evaluation:  Dietary intake, exercise, meal planning, , and body weight in 1 month(s).

## 2017-09-15 NOTE — Patient Instructions (Signed)
Goals 1. Follow My Plate Method  Eat 2-3 carb choices per meal 2. Increase fresh fruits and vegetables. 3. Exercise 30-60 minutes 3 4. Avoid snacks between meals. Lose 1 lbs per week

## 2017-10-11 ENCOUNTER — Encounter (HOSPITAL_COMMUNITY): Payer: Self-pay

## 2017-10-11 ENCOUNTER — Ambulatory Visit (HOSPITAL_COMMUNITY)
Admission: RE | Admit: 2017-10-11 | Discharge: 2017-10-11 | Disposition: A | Discharge status: Home / Self Care | Payer: BLUE CROSS/BLUE SHIELD | Source: Ambulatory Visit | Attending: Family Medicine | Admitting: Family Medicine

## 2017-10-11 DIAGNOSIS — Z1231 Encounter for screening mammogram for malignant neoplasm of breast: Secondary | ICD-10-CM | POA: Diagnosis not present

## 2017-10-20 DIAGNOSIS — Z1389 Encounter for screening for other disorder: Secondary | ICD-10-CM | POA: Diagnosis not present

## 2017-10-20 DIAGNOSIS — I10 Essential (primary) hypertension: Secondary | ICD-10-CM | POA: Diagnosis not present

## 2017-10-20 DIAGNOSIS — E663 Overweight: Secondary | ICD-10-CM | POA: Diagnosis not present

## 2017-10-20 DIAGNOSIS — Z6828 Body mass index (BMI) 28.0-28.9, adult: Secondary | ICD-10-CM | POA: Diagnosis not present

## 2017-10-20 DIAGNOSIS — E782 Mixed hyperlipidemia: Secondary | ICD-10-CM | POA: Diagnosis not present

## 2017-10-25 ENCOUNTER — Encounter: Payer: BLUE CROSS/BLUE SHIELD | Attending: Internal Medicine | Admitting: Nutrition

## 2017-10-25 VITALS — Ht 61.0 in | Wt 149.0 lb

## 2017-10-25 DIAGNOSIS — E7800 Pure hypercholesterolemia, unspecified: Secondary | ICD-10-CM | POA: Insufficient documentation

## 2017-10-25 NOTE — Patient Instructions (Signed)
Goals . Increase fiber in diet Cut out processed meals Exercise 30-60 minutes 3-4 times per week Drink only water Follow 1200 calorie meal plan Lose 1-2 lbs per week

## 2017-10-25 NOTE — Progress Notes (Signed)
  Medical Nutrition Therapy:  Appt start time: 1600 end time:  1630.   Assessment:  Primary concerns today: Overweight and Hyperlipidemia.Marland Kitchen  BMI is 28 but she is not use to weighing this much. Tchol  296  mg/dl.  Hasn't been on meds for cholesterol but just went back on them. Just started back on Lipitor.   Changed since last visit-trying to eat more fresh fruits and vegetables and cutting down on fast food or fried/processed foods. Still working on timing of her meals. Hasn't started exercising.    Preferred Learning Style:   No preference indicated   Learning Readiness:   Ready  Change in progress   MEDICATIONS: see list   DIETARY INTAKE:  24-hr recall:  B ( AM):  Apple,  Break yogurt Break nothing Lunch: Pot roast with pot with potatoes and carrots, water, Dinner: Green beans, boiled potatoes, hamburger and gravy, water  Beverages: Water  Usual physical activity: Walking at work, home  Estimated energy needs: 1200 calories 135  g carbohydrates 90 g protein 33 g fat  Progress Towards Goal(s):  In progress.   Nutritional Diagnosis:  NB-1.1 Food and nutrition-related knowledge deficit As related to Overweight.  As evidenced by BMI of 28.     Intervention: Nutrition and Heart Healthy Weight loss  education provided on My Plate, CHO counting, meal planning, portion sizes, timing of meals, avoiding snacks between meals. Need to drink water only and no diet sodas, juice, tea or SF beverages.  Benefits of 150 minutes of exercise per week.   Goals . Increase fiber in diet Cut out processed meals Exercise 30-60 minutes 3-4 times per week Drink only water Follow 1200 calorie meal plan Lose 1-2 lbs per week   Teaching Method Utilized:  Visual Auditory Hands on  Handouts given during visit include:  The Plate Method   Meal Plan Card  Diabetes Instructions   Barriers to learning/adherence to lifestyle change: none  Demonstrated degree of understanding  via:  Teach Back   Monitoring/Evaluation:  Dietary intake, exercise, meal planning, , and body weight in 3  month(s).

## 2017-11-02 ENCOUNTER — Encounter: Payer: Self-pay | Admitting: Nutrition

## 2017-11-19 DIAGNOSIS — E663 Overweight: Secondary | ICD-10-CM | POA: Diagnosis not present

## 2017-11-19 DIAGNOSIS — Z6828 Body mass index (BMI) 28.0-28.9, adult: Secondary | ICD-10-CM | POA: Diagnosis not present

## 2017-11-19 DIAGNOSIS — M7072 Other bursitis of hip, left hip: Secondary | ICD-10-CM | POA: Diagnosis not present

## 2017-11-19 DIAGNOSIS — Z1389 Encounter for screening for other disorder: Secondary | ICD-10-CM | POA: Diagnosis not present

## 2017-12-16 DIAGNOSIS — Z1389 Encounter for screening for other disorder: Secondary | ICD-10-CM | POA: Diagnosis not present

## 2017-12-16 DIAGNOSIS — M25561 Pain in right knee: Secondary | ICD-10-CM | POA: Diagnosis not present

## 2017-12-16 DIAGNOSIS — E663 Overweight: Secondary | ICD-10-CM | POA: Diagnosis not present

## 2017-12-16 DIAGNOSIS — Z6828 Body mass index (BMI) 28.0-28.9, adult: Secondary | ICD-10-CM | POA: Diagnosis not present

## 2017-12-24 DIAGNOSIS — I10 Essential (primary) hypertension: Secondary | ICD-10-CM | POA: Diagnosis not present

## 2017-12-24 DIAGNOSIS — Z1389 Encounter for screening for other disorder: Secondary | ICD-10-CM | POA: Diagnosis not present

## 2017-12-24 DIAGNOSIS — Z6828 Body mass index (BMI) 28.0-28.9, adult: Secondary | ICD-10-CM | POA: Diagnosis not present

## 2017-12-24 DIAGNOSIS — E782 Mixed hyperlipidemia: Secondary | ICD-10-CM | POA: Diagnosis not present

## 2017-12-30 ENCOUNTER — Other Ambulatory Visit (HOSPITAL_COMMUNITY): Payer: Self-pay | Admitting: Family Medicine

## 2017-12-30 DIAGNOSIS — M25561 Pain in right knee: Secondary | ICD-10-CM

## 2018-01-06 ENCOUNTER — Ambulatory Visit (HOSPITAL_COMMUNITY)
Admission: RE | Admit: 2018-01-06 | Discharge: 2018-01-06 | Disposition: A | Discharge status: Home / Self Care | Payer: BLUE CROSS/BLUE SHIELD | Source: Ambulatory Visit | Attending: Family Medicine | Admitting: Family Medicine

## 2018-01-06 DIAGNOSIS — X58XXXD Exposure to other specified factors, subsequent encounter: Secondary | ICD-10-CM | POA: Insufficient documentation

## 2018-01-06 DIAGNOSIS — S8991XD Unspecified injury of right lower leg, subsequent encounter: Secondary | ICD-10-CM | POA: Diagnosis not present

## 2018-01-06 DIAGNOSIS — M25561 Pain in right knee: Secondary | ICD-10-CM

## 2018-01-10 ENCOUNTER — Ambulatory Visit: Payer: BLUE CROSS/BLUE SHIELD | Admitting: Nutrition

## 2018-01-27 ENCOUNTER — Ambulatory Visit (INDEPENDENT_AMBULATORY_CARE_PROVIDER_SITE_OTHER): Payer: BLUE CROSS/BLUE SHIELD | Admitting: Otolaryngology

## 2018-01-27 DIAGNOSIS — R070 Pain in throat: Secondary | ICD-10-CM | POA: Diagnosis not present

## 2018-01-27 DIAGNOSIS — K219 Gastro-esophageal reflux disease without esophagitis: Secondary | ICD-10-CM

## 2018-04-12 DIAGNOSIS — R51 Headache: Secondary | ICD-10-CM | POA: Diagnosis not present

## 2018-04-12 DIAGNOSIS — Z79899 Other long term (current) drug therapy: Secondary | ICD-10-CM | POA: Diagnosis not present

## 2018-04-12 DIAGNOSIS — G43719 Chronic migraine without aura, intractable, without status migrainosus: Secondary | ICD-10-CM | POA: Diagnosis not present

## 2018-04-12 DIAGNOSIS — Z049 Encounter for examination and observation for unspecified reason: Secondary | ICD-10-CM | POA: Diagnosis not present

## 2018-04-28 DIAGNOSIS — G43719 Chronic migraine without aura, intractable, without status migrainosus: Secondary | ICD-10-CM | POA: Diagnosis not present

## 2018-05-26 DIAGNOSIS — G518 Other disorders of facial nerve: Secondary | ICD-10-CM | POA: Diagnosis not present

## 2018-05-26 DIAGNOSIS — M542 Cervicalgia: Secondary | ICD-10-CM | POA: Diagnosis not present

## 2018-05-26 DIAGNOSIS — R51 Headache: Secondary | ICD-10-CM | POA: Diagnosis not present

## 2018-05-26 DIAGNOSIS — M791 Myalgia, unspecified site: Secondary | ICD-10-CM | POA: Diagnosis not present

## 2018-05-26 DIAGNOSIS — G43719 Chronic migraine without aura, intractable, without status migrainosus: Secondary | ICD-10-CM | POA: Diagnosis not present

## 2018-08-18 DIAGNOSIS — Z1389 Encounter for screening for other disorder: Secondary | ICD-10-CM | POA: Diagnosis not present

## 2018-08-18 DIAGNOSIS — E663 Overweight: Secondary | ICD-10-CM | POA: Diagnosis not present

## 2018-08-18 DIAGNOSIS — M7551 Bursitis of right shoulder: Secondary | ICD-10-CM | POA: Diagnosis not present

## 2018-08-18 DIAGNOSIS — M1612 Unilateral primary osteoarthritis, left hip: Secondary | ICD-10-CM | POA: Diagnosis not present

## 2018-08-18 DIAGNOSIS — Z6828 Body mass index (BMI) 28.0-28.9, adult: Secondary | ICD-10-CM | POA: Diagnosis not present

## 2018-09-27 DIAGNOSIS — G43719 Chronic migraine without aura, intractable, without status migrainosus: Secondary | ICD-10-CM | POA: Diagnosis not present

## 2018-10-14 DIAGNOSIS — M5413 Radiculopathy, cervicothoracic region: Secondary | ICD-10-CM | POA: Diagnosis not present

## 2018-10-14 DIAGNOSIS — M9901 Segmental and somatic dysfunction of cervical region: Secondary | ICD-10-CM | POA: Diagnosis not present

## 2018-10-14 DIAGNOSIS — M546 Pain in thoracic spine: Secondary | ICD-10-CM | POA: Diagnosis not present

## 2018-10-14 DIAGNOSIS — M9902 Segmental and somatic dysfunction of thoracic region: Secondary | ICD-10-CM | POA: Diagnosis not present

## 2018-10-24 ENCOUNTER — Other Ambulatory Visit (HOSPITAL_COMMUNITY): Payer: Self-pay | Admitting: Family Medicine

## 2018-10-24 DIAGNOSIS — Z1231 Encounter for screening mammogram for malignant neoplasm of breast: Secondary | ICD-10-CM

## 2018-11-02 ENCOUNTER — Ambulatory Visit (HOSPITAL_COMMUNITY)
Admission: RE | Admit: 2018-11-02 | Discharge: 2018-11-02 | Disposition: A | Discharge status: Home / Self Care | Payer: BLUE CROSS/BLUE SHIELD | Source: Ambulatory Visit | Attending: Family Medicine | Admitting: Family Medicine

## 2018-11-02 DIAGNOSIS — Z1231 Encounter for screening mammogram for malignant neoplasm of breast: Secondary | ICD-10-CM | POA: Diagnosis not present

## 2018-11-25 DIAGNOSIS — Z6826 Body mass index (BMI) 26.0-26.9, adult: Secondary | ICD-10-CM | POA: Diagnosis not present

## 2018-11-25 DIAGNOSIS — J069 Acute upper respiratory infection, unspecified: Secondary | ICD-10-CM | POA: Diagnosis not present

## 2018-11-25 DIAGNOSIS — E663 Overweight: Secondary | ICD-10-CM | POA: Diagnosis not present

## 2018-11-25 DIAGNOSIS — Z1389 Encounter for screening for other disorder: Secondary | ICD-10-CM | POA: Diagnosis not present

## 2019-01-17 DIAGNOSIS — Z6826 Body mass index (BMI) 26.0-26.9, adult: Secondary | ICD-10-CM | POA: Diagnosis not present

## 2019-01-17 DIAGNOSIS — H6502 Acute serous otitis media, left ear: Secondary | ICD-10-CM | POA: Diagnosis not present

## 2019-01-30 DIAGNOSIS — M546 Pain in thoracic spine: Secondary | ICD-10-CM | POA: Diagnosis not present

## 2019-01-30 DIAGNOSIS — M9902 Segmental and somatic dysfunction of thoracic region: Secondary | ICD-10-CM | POA: Diagnosis not present

## 2019-01-30 DIAGNOSIS — M9901 Segmental and somatic dysfunction of cervical region: Secondary | ICD-10-CM | POA: Diagnosis not present

## 2019-01-30 DIAGNOSIS — M542 Cervicalgia: Secondary | ICD-10-CM | POA: Diagnosis not present

## 2019-01-30 DIAGNOSIS — G43719 Chronic migraine without aura, intractable, without status migrainosus: Secondary | ICD-10-CM | POA: Diagnosis not present

## 2019-02-24 DIAGNOSIS — M542 Cervicalgia: Secondary | ICD-10-CM | POA: Diagnosis not present

## 2019-02-24 DIAGNOSIS — M9902 Segmental and somatic dysfunction of thoracic region: Secondary | ICD-10-CM | POA: Diagnosis not present

## 2019-02-24 DIAGNOSIS — M546 Pain in thoracic spine: Secondary | ICD-10-CM | POA: Diagnosis not present

## 2019-02-24 DIAGNOSIS — M9901 Segmental and somatic dysfunction of cervical region: Secondary | ICD-10-CM | POA: Diagnosis not present

## 2019-03-02 DIAGNOSIS — L718 Other rosacea: Secondary | ICD-10-CM | POA: Diagnosis not present

## 2019-03-13 DIAGNOSIS — M9901 Segmental and somatic dysfunction of cervical region: Secondary | ICD-10-CM | POA: Diagnosis not present

## 2019-03-13 DIAGNOSIS — M5442 Lumbago with sciatica, left side: Secondary | ICD-10-CM | POA: Diagnosis not present

## 2019-03-13 DIAGNOSIS — M542 Cervicalgia: Secondary | ICD-10-CM | POA: Diagnosis not present

## 2019-03-13 DIAGNOSIS — M9903 Segmental and somatic dysfunction of lumbar region: Secondary | ICD-10-CM | POA: Diagnosis not present

## 2019-04-17 DIAGNOSIS — E7849 Other hyperlipidemia: Secondary | ICD-10-CM | POA: Diagnosis not present

## 2019-04-17 DIAGNOSIS — E663 Overweight: Secondary | ICD-10-CM | POA: Diagnosis not present

## 2019-04-17 DIAGNOSIS — I10 Essential (primary) hypertension: Secondary | ICD-10-CM | POA: Diagnosis not present

## 2019-04-17 DIAGNOSIS — F329 Major depressive disorder, single episode, unspecified: Secondary | ICD-10-CM | POA: Diagnosis not present

## 2019-04-17 DIAGNOSIS — Z6827 Body mass index (BMI) 27.0-27.9, adult: Secondary | ICD-10-CM | POA: Diagnosis not present

## 2019-04-21 DIAGNOSIS — M9902 Segmental and somatic dysfunction of thoracic region: Secondary | ICD-10-CM | POA: Diagnosis not present

## 2019-04-21 DIAGNOSIS — M9901 Segmental and somatic dysfunction of cervical region: Secondary | ICD-10-CM | POA: Diagnosis not present

## 2019-04-21 DIAGNOSIS — M546 Pain in thoracic spine: Secondary | ICD-10-CM | POA: Diagnosis not present

## 2019-04-21 DIAGNOSIS — M542 Cervicalgia: Secondary | ICD-10-CM | POA: Diagnosis not present

## 2019-04-24 DIAGNOSIS — M542 Cervicalgia: Secondary | ICD-10-CM | POA: Diagnosis not present

## 2019-04-24 DIAGNOSIS — M9902 Segmental and somatic dysfunction of thoracic region: Secondary | ICD-10-CM | POA: Diagnosis not present

## 2019-04-24 DIAGNOSIS — M546 Pain in thoracic spine: Secondary | ICD-10-CM | POA: Diagnosis not present

## 2019-04-24 DIAGNOSIS — M9901 Segmental and somatic dysfunction of cervical region: Secondary | ICD-10-CM | POA: Diagnosis not present

## 2019-05-22 DIAGNOSIS — M542 Cervicalgia: Secondary | ICD-10-CM | POA: Diagnosis not present

## 2019-05-22 DIAGNOSIS — M9902 Segmental and somatic dysfunction of thoracic region: Secondary | ICD-10-CM | POA: Diagnosis not present

## 2019-05-22 DIAGNOSIS — M546 Pain in thoracic spine: Secondary | ICD-10-CM | POA: Diagnosis not present

## 2019-05-22 DIAGNOSIS — M9901 Segmental and somatic dysfunction of cervical region: Secondary | ICD-10-CM | POA: Diagnosis not present

## 2019-05-23 DIAGNOSIS — G43719 Chronic migraine without aura, intractable, without status migrainosus: Secondary | ICD-10-CM | POA: Diagnosis not present

## 2019-06-05 DIAGNOSIS — M542 Cervicalgia: Secondary | ICD-10-CM | POA: Diagnosis not present

## 2019-06-05 DIAGNOSIS — M5030 Other cervical disc degeneration, unspecified cervical region: Secondary | ICD-10-CM | POA: Diagnosis not present

## 2019-08-16 DIAGNOSIS — E663 Overweight: Secondary | ICD-10-CM | POA: Diagnosis not present

## 2019-08-16 DIAGNOSIS — I10 Essential (primary) hypertension: Secondary | ICD-10-CM | POA: Diagnosis not present

## 2019-08-16 DIAGNOSIS — G4709 Other insomnia: Secondary | ICD-10-CM | POA: Diagnosis not present

## 2019-08-16 DIAGNOSIS — E782 Mixed hyperlipidemia: Secondary | ICD-10-CM | POA: Diagnosis not present

## 2019-08-16 DIAGNOSIS — Z6827 Body mass index (BMI) 27.0-27.9, adult: Secondary | ICD-10-CM | POA: Diagnosis not present

## 2019-09-14 ENCOUNTER — Other Ambulatory Visit: Payer: Self-pay

## 2019-09-14 ENCOUNTER — Ambulatory Visit (INDEPENDENT_AMBULATORY_CARE_PROVIDER_SITE_OTHER): Payer: BC Managed Care – PPO | Admitting: Otolaryngology

## 2019-09-14 DIAGNOSIS — R070 Pain in throat: Secondary | ICD-10-CM

## 2019-09-25 DIAGNOSIS — M9902 Segmental and somatic dysfunction of thoracic region: Secondary | ICD-10-CM | POA: Diagnosis not present

## 2019-09-25 DIAGNOSIS — M9903 Segmental and somatic dysfunction of lumbar region: Secondary | ICD-10-CM | POA: Diagnosis not present

## 2019-09-25 DIAGNOSIS — M545 Low back pain: Secondary | ICD-10-CM | POA: Diagnosis not present

## 2019-09-25 DIAGNOSIS — M546 Pain in thoracic spine: Secondary | ICD-10-CM | POA: Diagnosis not present

## 2019-09-29 ENCOUNTER — Other Ambulatory Visit (HOSPITAL_COMMUNITY): Payer: Self-pay | Admitting: Family Medicine

## 2019-09-29 DIAGNOSIS — Z1231 Encounter for screening mammogram for malignant neoplasm of breast: Secondary | ICD-10-CM

## 2019-10-06 DIAGNOSIS — G43719 Chronic migraine without aura, intractable, without status migrainosus: Secondary | ICD-10-CM | POA: Diagnosis not present

## 2019-10-09 DIAGNOSIS — M5030 Other cervical disc degeneration, unspecified cervical region: Secondary | ICD-10-CM | POA: Diagnosis not present

## 2019-10-09 DIAGNOSIS — M542 Cervicalgia: Secondary | ICD-10-CM | POA: Diagnosis not present

## 2019-10-11 ENCOUNTER — Other Ambulatory Visit: Payer: Self-pay | Admitting: Sports Medicine

## 2019-10-11 DIAGNOSIS — M5020 Other cervical disc displacement, unspecified cervical region: Secondary | ICD-10-CM

## 2019-10-18 ENCOUNTER — Other Ambulatory Visit: Payer: Self-pay

## 2019-10-18 ENCOUNTER — Ambulatory Visit (HOSPITAL_COMMUNITY)
Admission: RE | Admit: 2019-10-18 | Discharge: 2019-10-18 | Disposition: A | Discharge status: Home / Self Care | Payer: BC Managed Care – PPO | Source: Ambulatory Visit | Attending: Sports Medicine | Admitting: Sports Medicine

## 2019-10-18 DIAGNOSIS — M5020 Other cervical disc displacement, unspecified cervical region: Secondary | ICD-10-CM | POA: Insufficient documentation

## 2019-10-18 DIAGNOSIS — M542 Cervicalgia: Secondary | ICD-10-CM | POA: Diagnosis not present

## 2019-10-23 DIAGNOSIS — M5030 Other cervical disc degeneration, unspecified cervical region: Secondary | ICD-10-CM | POA: Diagnosis not present

## 2019-10-23 DIAGNOSIS — M542 Cervicalgia: Secondary | ICD-10-CM | POA: Diagnosis not present

## 2019-10-23 DIAGNOSIS — M47812 Spondylosis without myelopathy or radiculopathy, cervical region: Secondary | ICD-10-CM | POA: Diagnosis not present

## 2019-10-27 DIAGNOSIS — M9951 Intervertebral disc stenosis of neural canal of cervical region: Secondary | ICD-10-CM | POA: Diagnosis not present

## 2019-10-27 DIAGNOSIS — M47892 Other spondylosis, cervical region: Secondary | ICD-10-CM | POA: Diagnosis not present

## 2019-10-27 DIAGNOSIS — M62838 Other muscle spasm: Secondary | ICD-10-CM | POA: Diagnosis not present

## 2019-10-27 DIAGNOSIS — M542 Cervicalgia: Secondary | ICD-10-CM | POA: Diagnosis not present

## 2019-10-30 DIAGNOSIS — M9951 Intervertebral disc stenosis of neural canal of cervical region: Secondary | ICD-10-CM | POA: Diagnosis not present

## 2019-10-30 DIAGNOSIS — M542 Cervicalgia: Secondary | ICD-10-CM | POA: Diagnosis not present

## 2019-10-30 DIAGNOSIS — M62838 Other muscle spasm: Secondary | ICD-10-CM | POA: Diagnosis not present

## 2019-10-30 DIAGNOSIS — M47892 Other spondylosis, cervical region: Secondary | ICD-10-CM | POA: Diagnosis not present

## 2019-11-06 ENCOUNTER — Inpatient Hospital Stay (HOSPITAL_COMMUNITY): Admission: RE | Admit: 2019-11-06 | Payer: BC Managed Care – PPO | Source: Ambulatory Visit

## 2019-11-29 DIAGNOSIS — E663 Overweight: Secondary | ICD-10-CM | POA: Diagnosis not present

## 2019-11-29 DIAGNOSIS — G4709 Other insomnia: Secondary | ICD-10-CM | POA: Diagnosis not present

## 2019-11-29 DIAGNOSIS — Z6827 Body mass index (BMI) 27.0-27.9, adult: Secondary | ICD-10-CM | POA: Diagnosis not present

## 2019-11-29 DIAGNOSIS — R61 Generalized hyperhidrosis: Secondary | ICD-10-CM | POA: Diagnosis not present

## 2019-12-27 ENCOUNTER — Other Ambulatory Visit: Payer: Self-pay

## 2019-12-27 ENCOUNTER — Ambulatory Visit (HOSPITAL_COMMUNITY)
Admission: RE | Admit: 2019-12-27 | Discharge: 2019-12-27 | Disposition: A | Discharge status: Home / Self Care | Payer: BC Managed Care – PPO | Source: Ambulatory Visit | Attending: Family Medicine | Admitting: Family Medicine

## 2019-12-27 DIAGNOSIS — Z1231 Encounter for screening mammogram for malignant neoplasm of breast: Secondary | ICD-10-CM | POA: Diagnosis not present

## 2020-01-01 DIAGNOSIS — M47812 Spondylosis without myelopathy or radiculopathy, cervical region: Secondary | ICD-10-CM | POA: Diagnosis not present

## 2020-01-01 DIAGNOSIS — M542 Cervicalgia: Secondary | ICD-10-CM | POA: Diagnosis not present

## 2020-01-01 DIAGNOSIS — M5030 Other cervical disc degeneration, unspecified cervical region: Secondary | ICD-10-CM | POA: Diagnosis not present

## 2020-01-08 DIAGNOSIS — E663 Overweight: Secondary | ICD-10-CM | POA: Diagnosis not present

## 2020-01-08 DIAGNOSIS — Z1389 Encounter for screening for other disorder: Secondary | ICD-10-CM | POA: Diagnosis not present

## 2020-01-08 DIAGNOSIS — Z6828 Body mass index (BMI) 28.0-28.9, adult: Secondary | ICD-10-CM | POA: Diagnosis not present

## 2020-01-08 DIAGNOSIS — N819 Female genital prolapse, unspecified: Secondary | ICD-10-CM | POA: Diagnosis not present

## 2020-01-09 ENCOUNTER — Other Ambulatory Visit (HOSPITAL_COMMUNITY)
Admission: RE | Admit: 2020-01-09 | Discharge: 2020-01-09 | Disposition: A | Discharge status: Home / Self Care | Payer: BC Managed Care – PPO | Source: Ambulatory Visit | Attending: Obstetrics and Gynecology | Admitting: Obstetrics and Gynecology

## 2020-01-09 ENCOUNTER — Encounter: Payer: Self-pay | Admitting: Obstetrics and Gynecology

## 2020-01-09 ENCOUNTER — Other Ambulatory Visit: Payer: Self-pay

## 2020-01-09 ENCOUNTER — Ambulatory Visit (INDEPENDENT_AMBULATORY_CARE_PROVIDER_SITE_OTHER): Payer: BC Managed Care – PPO | Admitting: Obstetrics and Gynecology

## 2020-01-09 VITALS — BP 154/83 | HR 64 | Ht 61.0 in | Wt 149.2 lb

## 2020-01-09 DIAGNOSIS — N941 Unspecified dyspareunia: Secondary | ICD-10-CM | POA: Diagnosis not present

## 2020-01-09 DIAGNOSIS — N812 Incomplete uterovaginal prolapse: Secondary | ICD-10-CM

## 2020-01-09 DIAGNOSIS — Z01419 Encounter for gynecological examination (general) (routine) without abnormal findings: Secondary | ICD-10-CM | POA: Insufficient documentation

## 2020-01-09 DIAGNOSIS — N8110 Cystocele, unspecified: Secondary | ICD-10-CM
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
## 2020-01-09 NOTE — Patient Instructions (Signed)
Pelvic Organ Prolapse Pelvic organ prolapse is the stretching, bulging, or dropping of pelvic organs into an abnormal position. It happens when the muscles and tissues that surround and support pelvic structures become weak or stretched. Pelvic organ prolapse can involve the:  Vagina (vaginal prolapse).  Uterus (uterine prolapse).  Bladder (cystocele).  Rectum (rectocele).  Intestines (enterocele). When organs other than the vagina are involved, they often bulge into the vagina or protrude from the vagina, depending on how severe the prolapse is. What are the causes? This condition may be caused by:  Pregnancy, labor, and childbirth.  Past pelvic surgery.  Decreased production of the hormone estrogen associated with menopause.  Consistently lifting more than 50 lb (23 kg).  Obesity.  Long-term inability to pass stool (chronic constipation).  A cough that lasts a long time (chronic).  Buildup of fluid in the abdomen due to certain diseases and other conditions. What are the signs or symptoms? Symptoms of this condition include:  Passing a little urine (loss of bladder control) when you cough, sneeze, strain, and exercise (stress incontinence). This may be worse immediately after childbirth. It may gradually improve over time.  Feeling pressure in your pelvis or vagina. This pressure may increase when you cough or when you are passing stool.  A bulge that protrudes from the opening of your vagina.  Difficulty passing urine or stool.  Pain in your lower back.  Pain, discomfort, or disinterest in sex.  Repeated bladder infections (urinary tract infections).  Difficulty inserting a tampon. In some people, this condition causes no symptoms. How is this diagnosed? This condition may be diagnosed based on a vaginal and rectal exam. During the exam, you may be asked to cough and strain while you are lying down, sitting, and standing up. Your health care provider will  determine if other tests are required, such as bladder function tests. How is this treated? Treatment for this condition may depend on your symptoms. Treatment may include:  Lifestyle changes, such as changes to your diet.  Emptying your bladder at scheduled times (bladder training therapy). This can help reduce or avoid urinary incontinence.  Estrogen. Estrogen may help mild prolapse by increasing the strength and tone of pelvic floor muscles.  Kegel exercises. These may help mild cases of prolapse by strengthening and tightening the muscles of the pelvic floor.  A soft, flexible device that helps support the vaginal walls and keep pelvic organs in place (pessary). This is inserted into your vagina by your health care provider.  Surgery. This is often the only form of treatment for severe prolapse. Follow these instructions at home:  Avoid drinking beverages that contain caffeine or alcohol.  Increase your intake of high-fiber foods. This can help decrease constipation and straining during bowel movements.  Lose weight if recommended by your health care provider.  Wear a sanitary pad or adult diapers if you have urinary incontinence.  Avoid heavy lifting and straining with exercise and work. Do not hold your breath when you perform mild to moderate lifting and exercise activities. Limit your activities as directed by your health care provider.  Do Kegel exercises as directed by your health care provider. To do this: ? Squeeze your pelvic floor muscles tight. You should feel a tight lift in your rectal area and a tightness in your vaginal area. Keep your stomach, buttocks, and legs relaxed. ? Hold the muscles tight for up to 10 seconds. ? Relax your muscles. ? Repeat this exercise 50 times a day,   or as many times as told by your health care provider. Continue to do this exercise for at least 4-6 weeks, or for as long as told by your health care provider.  Take over-the-counter and  prescription medicines only as told by your health care provider.  If you have a pessary, take care of it as told by your health care provider.  Keep all follow-up visits as told by your health care provider. This is important. Contact a health care provider if you:  Have symptoms that interfere with your daily activities or sex life.  Need medicine to help with the discomfort.  Notice bleeding from your vagina that is not related to your period.  Have a fever.  Have pain or bleeding when you urinate.  Have bleeding when you pass stool.  Pass urine when you have sex.  Have chronic constipation.  Have a pessary that falls out.  Have bad smelling vaginal discharge.  Have an unusual, low pain in your abdomen. Summary  Pelvic organ prolapse is the stretching, bulging, or dropping of pelvic organs into an abnormal position. It happens when the muscles and tissues that surround and support pelvic structures become weak or stretched.  When organs other than the vagina are involved, they often bulge into the vagina or protrude from the vagina, depending on how severe the prolapse is.  In most cases, this condition needs to be treated only if it produces symptoms. Treatment may include lifestyle changes, estrogen, Kegel exercises, pessary insertion, or surgery.  Avoid heavy lifting and straining with exercise and work. Do not hold your breath when you perform mild to moderate lifting and exercise activities. Limit your activities as directed by your health care provider. This information is not intended to replace advice given to you by your health care provider. Make sure you discuss any questions you have with your health care provider. Document Revised: 12/22/2017 Document Reviewed: 12/22/2017 Elsevier Patient Education  2020 [employer] About Cystocele  Overview  The pelvic organs, including the bladder, are normally supported by pelvic floor muscles and ligaments.  When  these muscles and ligaments are stretched, weakened or torn, the wall between the bladder and the vagina sags or herniates causing a prolapse, sometimes called a cystocele.  This condition may cause discomfort and problems with emptying the bladder.  It can be present in various stages.  Some people are not aware of the changes.  Others may notice changes at the vaginal opening or a feeling of the bladder dropping outside the body.  Causes of a Cystocele  A cystocele is usually caused by muscle straining or stretching during childbirth.  In addition, cystocele is more common after menopause, because the hormone estrogen helps keep the elastic tissues around the pelvic organs strong.  A cystocele is more likely to occur when levels of estrogen decrease.  Other causes include: heavy lifting, chronic coughing, previous pelvic surgery and obesity.  Symptoms  A bladder that has dropped from its normal position may cause: unwanted urine leakage (stress incontinence), frequent urination or urge to urinate, incomplete emptying of the bladder (not feeling bladder relief after emptying), pain or discomfort in the vagina, pelvis, groin, lower back or lower abdomen and frequent urinary tract infections.  Mild cases may not cause any symptoms.  Treatment Options  Pelvic floor (Kegel) exercises:  Strength training the muscles in your genital area  Behavioral changes: Treating and preventing constipation, taking time to empty your bladder properly, learning to lift properly and/or   avoid heavy lifting when possible, stopping smoking, avoiding weight gain and treating a chronic cough or bronchitis.  A pessary: A vaginal support device is sometimes used to help pelvic support caused by muscle and ligament changes.  Surgery: Surgical repair may be necessary if symptoms cannot be managed with exercise, behavioral changes and a pessary.  Surgery is usually considered for severe cases.   2007, [employer] Anterior and Posterior Colporrhaphy  Anterior or posterior colporrhaphy is surgery to fix a prolapse of organs in the genital tract. Prolapse is a condition in which an organ bulges or drops down from its normal position. Organs that commonly prolapse include the rectum, bladder, vagina, and uterus. Prolapse can affect a single organ or several organs at the same time.  You may need this surgery if you have a severe prolapse that causes symptoms that interfere with your daily life and cannot be corrected with other treatments. Prolapse often worsens when women stop having their monthly periods (menopause) because estrogen loss weakens the muscles and tissues in the genital tract. Prolapse can also happen when the organs are damaged or weakened. This commonly happens after childbirth and as a result of aging. The type of colporrhaphy done depends on the type of genital prolapse. Types of genital prolapse include the following:  Cystocele. This is a prolapse of the bladder and the upper part of the front (anterior) wall of the vagina.  Rectocele. This is a prolapse of the rectum and the lower part of the back (posterior) wall of the vagina.  Enterocele. This is a prolapse of the small intestine. It appears as a bulge under the neck of the uterus at the top of the back wall of the vagina.  Procidentia. This is a complete prolapse of the uterus and the cervix. The prolapse can be seen and felt coming out of the vagina. Tell a health care provider about:  Any allergies you have.  All medicines you are taking, including vitamins, herbs, eye drops, creams, and over-the-counter medicines.  Any problems you or family members have had with anesthetic medicines.  Any blood disorders you have.  Any surgeries you have had.  Any medical conditions you have.  Smoking history or history of alcohol use.  Whether you are pregnant or may be pregnant. What are the risks? Generally, this is a  safe procedure. However, problems may occur, including:  Infection.  Bleeding.  Allergic reactions to medicines.  Damage to other structures or organs.  Problems urinating.  Incontinence.  Nerve damage.  Painful sex.  Constipation.  A blood clot that travels to your lungs. What happens before the procedure? Staying hydrated Follow instructions from your health care provider about hydration, which may include:  Up to 2 hours before the procedure - you may continue to drink clear liquids, such as water, clear fruit juice, black coffee, and plain tea. Eating and drinking restrictions Follow instructions from your health care provider about eating and drinking, which may include:  8 hours before the procedure - stop eating heavy meals or foods such as meat, fried foods, or fatty foods.  6 hours before the procedure - stop eating light meals or foods, such as toast or cereal.  6 hours before the procedure - stop drinking milk or drinks that contain milk.  2 hours before the procedure - stop drinking clear liquids. General instructions  Ask your health care provider about: ? Changing or stopping your regular medicines. This is especially important if you are  taking diabetes medicines or blood thinners. ? Taking medicines such as aspirin and ibuprofen. These medicines can thin your blood. Do not take these medicines before your procedure if your health care provider instructs you not to.  You may be given antibiotics to help prevent infection.  You may be instructed to use estrogen cream in your vagina to help prevent complications and promote healing.  Do not use any products that contain nicotine or tobacco, such as cigarettes and e-cigarettes, for at least 2 weeks before the procedure. If you need help quitting, ask your health care provider.  Plan to have someone take you home from the hospital. Also, arrange for someone to help you with activities during recovery. What  happens during the procedure?  To lower your risk of infection: ? Your health care team will wash or sanitize their hands. ? Your skin will be washed with soap. ? Hair may be removed from the surgical area.  An IV will be inserted into one of your veins.  You will be given one or more of the following: ? A medicine to help you relax (sedative). ? A medicine to make you fall asleep (general anesthetic).  You may be given antibiotics through your IV.  You will lie down on the operating table with your feet in stirrups.  A small, thin tube (catheter) will be inserted through your urethra into your bladder to drain urine during surgery and recovery.  An instrument (vaginal speculum) will be used to hold your vagina open.  Your health care provider will perform the procedure according to the type of repair you require: Anterior repair  An incision will be made in the midline section of the front part of the vaginal wall.  A triangular-shaped piece of vaginal tissue will be removed.  The stronger, healthier tissue will be sewn together in order to support the bladder.  These incisions may be closed with stitches (sutures).  Gauze packing will be placed inside your vagina. Posterior repair  An incision will be made midline on the back wall of the vagina.  A triangular portion of vaginal skin will be removed to expose the muscle.  Excess tissue will be removed, and stronger, healthier muscle and ligament tissue will be sewn together to support the rectum.  These incisions may be closed with stitches (sutures).  Gauze packing will be placed inside your vagina. Anterior and posterior repair  Both procedures will be done during the same surgery. The procedure may vary among health care providers and hospitals. What happens after the procedure?  Your blood pressure, heart rate, breathing rate, and blood oxygen level will be monitored until the medicines you were given have worn  off.  You will be given pain medicine as needed.  You will have a small tube in place to drain your bladder (urinary catheter). This will be in place until your bladder is working properly on its own.  You may have a gauze packing in your vagina for a few days to prevent bleeding.  You will start on a liquid diet and slowly move to a regular diet.  You will be encouraged to get up and walk as soon as you are able.  You may need to wear compression stockings. They help prevent blood clots and reduce swelling in your legs.  Do not drive for 24 hours if you were given a sedative. Summary  Anterior or posterior colporrhaphy is surgery to fix a prolapse of organs in the genital tract.  The type of repair done depends on the type of prolapse that is present.  Follow instructions from your health care provider about eating and drinking before the procedure.  You will be given a general anesthetic to make you fall asleep during the procedure. This information is not intended to replace advice given to you by your health care provider. Make sure you discuss any questions you have with your health care provider. Document Revised: 11/12/2017 Document Reviewed: 01/07/2017 Elsevier Patient Education  2020 [employer].

## 2020-01-11 LAB — CYTOLOGY - PAP
Comment: NEGATIVE
Diagnosis: NEGATIVE
High risk HPV: NEGATIVE

## 2020-01-12 NOTE — Progress Notes (Signed)
Normal pap testing with negative HPV. Repeat q 5 yr

## 2020-01-30 ENCOUNTER — Encounter: Payer: BC Managed Care – PPO | Admitting: Obstetrics and Gynecology

## 2020-01-31 ENCOUNTER — Ambulatory Visit (INDEPENDENT_AMBULATORY_CARE_PROVIDER_SITE_OTHER): Payer: BC Managed Care – PPO | Admitting: Obstetrics and Gynecology

## 2020-01-31 ENCOUNTER — Other Ambulatory Visit: Payer: Self-pay

## 2020-01-31 ENCOUNTER — Encounter: Payer: Self-pay | Admitting: Obstetrics and Gynecology

## 2020-01-31 VITALS — BP 142/87 | HR 63 | Ht 61.0 in | Wt 150.0 lb

## 2020-01-31 DIAGNOSIS — N812 Incomplete uterovaginal prolapse: Secondary | ICD-10-CM

## 2020-01-31 NOTE — Progress Notes (Addendum)
Patient ID: Emily Hawkins, female   DOB: 1961/04/22, 59 y.o.   MRN: [id number]  Preoperative History and Physical  Emily Hawkins is a 59 y.o. 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  No significant preoperative concerns. She is s/p ablation.  Proposed surgery: vaginal hysterectomy with anterior and posterior repair. ,bilateral salpingo oophorectomy  Past Medical History:  Diagnosis Date  . Arthritis   . History of kidney stones   . Hypertension   . Liver laceration   . Migraine   . PONV (postoperative nausea and vomiting)   . Vertigo    Past Surgical History:  Procedure Laterality Date  . ABDOMINAL SURGERY     lacerated liver repaired  . CARDIAC CATHETERIZATION    . CHOLECYSTECTOMY  10 yrs ago  . EXTRACORPOREAL SHOCK WAVE LITHOTRIPSY Left 04/12/2013   Procedure: EXTRACORPOREAL SHOCK WAVE LITHOTRIPSY (ESWL) LEFT RENAL CALCULUS;  Surgeon: Marissa Nestle, MD;  Location: [facility];  Service: Urology;  Laterality: Left;  . LASER ABLATION     uterus  . NOSE SURGERY    . TUBAL LIGATION     OB History  Gravida Para Term Preterm AB Living  2 2 2     2   SAB TAB Ectopic Multiple Live Births          2    # Outcome Date GA Lbr Len/2nd Weight Sex Delivery Anes PTL Lv  2 Term     F Vag-Spont   LIV  1 Term     M Vag-Spont   LIV  Patient denies any other pertinent gynecologic issues.   Current Outpatient Medications on File Prior to Visit  Medication Sig Dispense Refill  . acetaminophen (TYLENOL) 500 MG tablet Take by mouth.    . ALPRAZolam (XANAX) 1 MG tablet     . atorvastatin (LIPITOR) 20 MG tablet Take 20 mg by mouth daily.    Marland Kitchen lisinopril (PRINIVIL,ZESTRIL) 10 MG tablet Take 10 mg by mouth daily.     No current facility-administered medications on file prior to visit.   Allergies  Allergen Reactions  . Flagyl [Metronidazole Hcl] Other (See Comments)    Unknown reaction  . Latex Hives and Itching  . Oxycodone Nausea And  Vomiting and Other (See Comments)    vertigo  . Penicillins Hives and Itching  . Sulfa Antibiotics Hives and Itching    Social History:   reports that she has never smoked. She has never used smokeless tobacco. She reports that she does not drink alcohol or use drugs.  Family History  Problem Relation Age of Onset  . Hypertension Sister   . Asthma Sister     Review of Systems: Noncontributory  PHYSICAL EXAM: Blood pressure (!) 142/87, pulse 63, height 5\' 1"  (1.549 m), weight 150 lb (68 kg). General appearance - alert, well appearing, and in no distress Chest - clear to auscultation, no wheezes, rales or rhonchi, symmetric air entry Heart - normal rate and regular rhythm Abdomen - soft, nontender, nondistended, no masses or organomegaly Pelvic -  VAGINA: normal appearing vagina with moderate cystocele not reaching hymen ring. CERVIX: normal appearing cervix  UTERUS:2nd degree uterine descensus to within 2 cm of hymen RECTAL: rectal  bulge.  Extremities - peripheral pulses normal, no pedal edema, no clubbing or cyanosis  Labs: No results found for this or any previous visit (from the past 336 hour(s)).  Imaging Studies: No results found.  Assessment: There are no problems to  display for this patient.   Plan: Patient will undergo surgical management with vaginal hysterectomy  Bilateral salpingoophorectomy with anterior and posterior repair.  Final decision on ovaries after u/s. Pt currently uncertain. F/u 1-2 weeks for TV u/s  By signing my name below, I, Samul Dada, attest that this documentation has been prepared under the direction and in the presence of [name], MD. Electronically Signed: [place]. 01/31/20. 3:45 PM.  I personally performed the services described in this documentation, which was SCRIBED in my presence. The recorded information has been reviewed and considered accurate. It has been edited as necessary during review. [name], MD

## 2020-02-06 DIAGNOSIS — Z029 Encounter for administrative examinations, unspecified: Secondary | ICD-10-CM

## 2020-02-08 ENCOUNTER — Other Ambulatory Visit: Payer: Self-pay | Admitting: Obstetrics and Gynecology

## 2020-02-08 DIAGNOSIS — N812 Incomplete uterovaginal prolapse: Secondary | ICD-10-CM

## 2020-02-09 DIAGNOSIS — E663 Overweight: Secondary | ICD-10-CM | POA: Diagnosis not present

## 2020-02-09 DIAGNOSIS — M546 Pain in thoracic spine: Secondary | ICD-10-CM | POA: Diagnosis not present

## 2020-02-09 DIAGNOSIS — Z6829 Body mass index (BMI) 29.0-29.9, adult: Secondary | ICD-10-CM | POA: Diagnosis not present

## 2020-02-12 ENCOUNTER — Ambulatory Visit (INDEPENDENT_AMBULATORY_CARE_PROVIDER_SITE_OTHER): Payer: BC Managed Care – PPO

## 2020-02-12 ENCOUNTER — Other Ambulatory Visit: Payer: Self-pay

## 2020-02-12 DIAGNOSIS — N812 Incomplete uterovaginal prolapse: Secondary | ICD-10-CM

## 2020-02-12 NOTE — Progress Notes (Signed)
PELVIC US TA/TV: heterogeneous anteverted/retroflexed uterus with mult.fibroids,(#2) fundal subserosal fibroid 3.4 x 2.8 x 3.3 cm,(#2) anterior intramural vs submucosal fibroid 1.7 x 1.8 x 1.7 cm,complex thickened endometrium 5.7 mm,normal left ovary,right ovarian cyst with a thin septation 2.1 x 1.7 x 1.9 cm,right ovary slides,unable to slide left ovary

## 2020-02-19 ENCOUNTER — Other Ambulatory Visit: Payer: Self-pay

## 2020-02-19 ENCOUNTER — Telehealth (INDEPENDENT_AMBULATORY_CARE_PROVIDER_SITE_OTHER): Payer: BC Managed Care – PPO | Admitting: Obstetrics and Gynecology

## 2020-02-19 DIAGNOSIS — N812 Incomplete uterovaginal prolapse: Secondary | ICD-10-CM

## 2020-02-23 NOTE — Patient Instructions (Signed)
Emily Hawkins  02/23/2020     @PREFPERIOPPHARMACY @   Your procedure is scheduled on  03/05/2020 .  Report to [name] at  0730 A.M.  Call this number if you have problems the morning of surgery:  [phone]   Remember:  Do not eat or drink after midnight.                         Take these medicines the morning of surgery with A SIP OF WATER  Xanax(if needed), lisinopril.    Do not wear jewelry, make-up or nail polish.  Do not wear lotions, powders, or perfumes. Please wear deodorant and brush your teeth.  Do not shave 48 hours prior to surgery.  Men may shave face and neck.  Do not bring valuables to the hospital.  [facility] is not responsible for any belongings or valuables.  Contacts, dentures or bridgework may not be worn into surgery.  Leave your suitcase in the car.  After surgery it may be brought to your room.  For patients admitted to the hospital, discharge time will be determined by your treatment team.  Patients discharged the day of surgery will not be allowed to drive home.   Name and phone number of your driver:   family Special instructions:  DO NOT smoke the morning of your procedure.  Please read over the following fact sheets that you were given. Anesthesia Post-op Instructions and Care and Recovery After Surgery       Anterior and Posterior Colporrhaphy, Care After This sheet gives you information about how to care for yourself after your procedure. Your health care provider may also give you more specific instructions. If you have problems or questions, contact your health care provider. What can I expect after the procedure? After the procedure, it is common to have:  Pain in the surgical area.  Vaginal spotting and discharge. You will need to use a sanitary pad during this time.  Fatigue. Follow these instructions at home:  Incision care   Follow instructions from your health care provider about how to take care of  your incision. Make sure you: ? Wash your hands with soap and water before touching the incision area. If soap and water are not available, use hand sanitizer. ? Clean your incision as told by your health care provider. ? Leave stitches (sutures), skin glue, or adhesive strips in place. These skin closures may need to stay in place for 2 weeks or longer. If adhesive strip edges start to loosen and curl up, you may trim the loose edges. Do not remove adhesive strips completely unless your health care provider tells you to do that.  Check your incision area every day for signs of infection. Check for: ? Redness, swelling, or pain. ? Fluid or blood. ? Warmth. ? Pus or a bad smell.  Check your incision every day to make sure the incision area is not separating or opening up.  Do not take baths, swim, or use a hot tub until your health care provider approves. You may shower.  Keep the area between your vagina and rectum (perineal area) clean and dry. Make sure you clean the area after every bowel movement and each time you urinate.  Ask your health care provider if you can take a sitz bath or sit in a tub of clean, warm water. Activity  Take frequent, short walks followed  by rest periods throughout the day.  Avoid activities that take a lot of effort (are strenuous).  Do not lift anything that is heavier than 10 lb (4.5 kg), or the limit that your health care provider tells you, until he or she says that it is safe. Avoid pushing or pulling motions.  Avoid standing for long periods of time.  Do not douche, use tampons, or have sex until your health care provider says it is okay.  Return to your normal activities as told by your health care provider. Ask your health care provider what activities are safe for you.  Do not drive until your health care provider approves. To prevent constipation To prevent or treat constipation while you are taking prescription pain medicine, your health care  provider may recommend that you:  Take over-the-counter or prescription medicines.  Eat foods that are high in fiber, such as fresh fruits and vegetables, whole grains, and beans.  Drink enough fluid to keep your urine clear or pale yellow.  Limit foods that are high in fat and processed sugars, such as fried and sweet foods. General instructions  Take over-the-counter and prescription medicines only as told by your health care provider.  You may be instructed to do pelvic floor exercises (kegels) as told by your health care provider.  Do not drive or use heavy machinery while taking prescription pain medicine.  Wear compression stockings as told by your health care provider. These stockings help to prevent blood clots and reduce swelling in your legs.  Keep all follow-up visits as told by your health care provider. This is important. Contact a health care provider if:  Medicine does not help your pain.  You have frequent or urgent urination, or you are unable to completely empty your bladder.  You feel a burning sensation when urinating.  You have pus or a bad smell coming from your vaginal area.  You have redness, swelling, or increasing pain in the vaginal area. Get help right away if:  You have increased bleeding from the vaginal area.  You cannot urinate.  You have a fever or chills.  Your incision separates or opens.  You have trouble breathing. Summary  After the procedure, it is common to have pain, fatigue, spotting, and discharge from the vagina.  Keep the area between your vagina and rectum (perineal area) clean and dry. Make sure you clean the area after every bowel movement and each time you urinate.  Follow instructions from your health care provider about any activity restrictions after the procedure. This information is not intended to replace advice given to you by your health care provider. Make sure you discuss any questions you have with your  health care provider. Document Revised: 11/12/2017 Document Reviewed: 01/07/2017 Elsevier Patient Education  2020 [place].  Vaginal Hysterectomy, Care After Refer to this sheet in the next few weeks. These instructions provide you with information about caring for yourself after your procedure. Your health care provider may also give you more specific instructions. Your treatment has been planned according to current medical practices, but problems sometimes occur. Call your health care provider if you have any problems or questions after your procedure. What can I expect after the procedure? After the procedure, it is common to have:  Pain.  Soreness and numbness in your incision areas.  Vaginal bleeding and discharge.  Constipation.  Temporary problems emptying the bladder.  Feelings of sadness or other emotions. Follow these instructions at home: Medicines  Take  over-the-counter and prescription medicines only as told by your health care provider.  If you were prescribed an antibiotic medicine, take it as told by your health care provider. Do not stop taking the antibiotic even if you start to feel better.  Do not drive or operate heavy machinery while taking prescription pain medicine. Activity  Return to your normal activities as told by your health care provider. Ask your health care provider what activities are safe for you.  Get regular exercise as told by your health care provider. You may be told to take short walks every day and go farther each time.  Do not lift anything that is heavier than 10 lb (4.5 kg). General instructions   Do not put anything in your vagina for 6 weeks after your surgery or as told by your health care provider. This includes tampons and douches.  Do not have sex until your health care provider says you can.  Do not take baths, swim, or use a hot tub until your health care provider approves.  Drink enough fluid to keep your urine  clear or pale yellow.  Do not drive for 24 hours if you were given a sedative.  Keep all follow-up visits as told by your health care provider. This is important. Contact a health care provider if:  Your pain medicine is not helping.  You have a fever.  You have redness, swelling, or pain at your incision site.  You have blood, pus, or a bad-smelling discharge from your vagina.  You continue to have difficulty urinating. Get help right away if:  You have severe abdominal or back pain.  You have heavy bleeding from your vagina.  You have chest pain or shortness of breath. This information is not intended to replace advice given to you by your health care provider. Make sure you discuss any questions you have with your health care provider. Document Revised: 07/23/2016 Document Reviewed: 12/15/2015 Elsevier Patient Education  2020 [place] Anesthesia, Adult, Care After This sheet gives you information about how to care for yourself after your procedure. Your health care provider may also give you more specific instructions. If you have problems or questions, contact your health care provider. What can I expect after the procedure? After the procedure, the following side effects are common:  Pain or discomfort at the IV site.  Nausea.  Vomiting.  Sore throat.  Trouble concentrating.  Feeling cold or chills.  Weak or tired.  Sleepiness and fatigue.  Soreness and body aches. These side effects can affect parts of the body that were not involved in surgery. Follow these instructions at home:  For at least 24 hours after the procedure:  Have a responsible adult stay with you. It is important to have someone help care for you until you are awake and alert.  Rest as needed.  Do not: ? Participate in activities in which you could fall or become injured. ? Drive. ? Use heavy machinery. ? Drink alcohol. ? Take sleeping pills or medicines that cause  drowsiness. ? Make important decisions or sign legal documents. ? Take care of children on your own. Eating and drinking  Follow any instructions from your health care provider about eating or drinking restrictions.  When you feel hungry, start by eating small amounts of foods that are soft and easy to digest (bland), such as toast. Gradually return to your regular diet.  Drink enough fluid to keep your urine pale yellow.  If you  vomit, rehydrate by drinking water, juice, or clear broth. General instructions  If you have sleep apnea, surgery and certain medicines can increase your risk for breathing problems. Follow instructions from your health care provider about wearing your sleep device: ? Anytime you are sleeping, including during daytime naps. ? While taking prescription pain medicines, sleeping medicines, or medicines that make you drowsy.  Return to your normal activities as told by your health care provider. Ask your health care provider what activities are safe for you.  Take over-the-counter and prescription medicines only as told by your health care provider.  If you smoke, do not smoke without supervision.  Keep all follow-up visits as told by your health care provider. This is important. Contact a health care provider if:  You have nausea or vomiting that does not get better with medicine.  You cannot eat or drink without vomiting.  You have pain that does not get better with medicine.  You are unable to pass urine.  You develop a skin rash.  You have a fever.  You have redness around your IV site that gets worse. Get help right away if:  You have difficulty breathing.  You have chest pain.  You have blood in your urine or stool, or you vomit blood. Summary  After the procedure, it is common to have a sore throat or nausea. It is also common to feel tired.  Have a responsible adult stay with you for the first 24 hours after general anesthesia. It is  important to have someone help care for you until you are awake and alert.  When you feel hungry, start by eating small amounts of foods that are soft and easy to digest (bland), such as toast. Gradually return to your regular diet.  Drink enough fluid to keep your urine pale yellow.  Return to your normal activities as told by your health care provider. Ask your health care provider what activities are safe for you. This information is not intended to replace advice given to you by your health care provider. Make sure you discuss any questions you have with your health care provider. Document Revised: 12/03/2017 Document Reviewed: 07/16/2017 Elsevier Patient Education  [place]. How to Use Chlorhexidine for Bathing Chlorhexidine gluconate (CHG) is a germ-killing (antiseptic) solution that is used to clean the skin. It can get rid of the bacteria that normally live on the skin and can keep them away for about 24 hours. To clean your skin with CHG, you may be given:  A CHG solution to use in the shower or as part of a sponge bath.  A prepackaged cloth that contains CHG. Cleaning your skin with CHG may help lower the risk for infection:  While you are staying in the intensive care unit of the hospital.  If you have a vascular access, such as a central line, to provide short-term or long-term access to your veins.  If you have a catheter to drain urine from your bladder.  If you are on a ventilator. A ventilator is a machine that helps you breathe by moving air in and out of your lungs.  After surgery. What are the risks? Risks of using CHG include:  A skin reaction.  Hearing loss, if CHG gets in your ears.  Eye injury, if CHG gets in your eyes and is not rinsed out.  The CHG product catching fire. Make sure that you avoid smoking and flames after applying CHG to your skin. Do not  use CHG:  If you have a chlorhexidine allergy or have previously reacted to  chlorhexidine.  On babies younger than 38 months of age. How to use CHG solution  Use CHG only as told by your health care provider, and follow the instructions on the label.  Use the full amount of CHG as directed. Usually, this is one bottle. During a shower Follow these steps when using CHG solution during a shower (unless your health care provider gives you different instructions): 1. Start the shower. 2. Use your normal soap and shampoo to wash your face and hair. 3. Turn off the shower or move out of the shower stream. 4. Pour the CHG onto a clean washcloth. Do not use any type of brush or rough-edged sponge. 5. Starting at your neck, lather your body down to your toes. Make sure you follow these instructions: ? If you will be having surgery, pay special attention to the part of your body where you will be having surgery. Scrub this area for at least 1 minute. ? Do not use CHG on your head or face. If the solution gets into your ears or eyes, rinse them well with water. ? Avoid your genital area. ? Avoid any areas of skin that have broken skin, cuts, or scrapes. ? Scrub your back and under your arms. Make sure to wash skin folds. 6. Let the lather sit on your skin for 1-2 minutes or as long as told by your health care provider. 7. Thoroughly rinse your entire body in the shower. Make sure that all body creases and crevices are rinsed well. 8. Dry off with a clean towel. Do not put any substances on your body afterward--such as powder, lotion, or perfume--unless you are told to do so by your health care provider. Only use lotions that are recommended by the manufacturer. 9. Put on clean clothes or pajamas. 10. If it is the night before your surgery, sleep in clean sheets.  During a sponge bath Follow these steps when using CHG solution during a sponge bath (unless your health care provider gives you different instructions): 1. Use your normal soap and shampoo to wash your face and  hair. 2. Pour the CHG onto a clean washcloth. 3. Starting at your neck, lather your body down to your toes. Make sure you follow these instructions: ? If you will be having surgery, pay special attention to the part of your body where you will be having surgery. Scrub this area for at least 1 minute. ? Do not use CHG on your head or face. If the solution gets into your ears or eyes, rinse them well with water. ? Avoid your genital area. ? Avoid any areas of skin that have broken skin, cuts, or scrapes. ? Scrub your back and under your arms. Make sure to wash skin folds. 4. Let the lather sit on your skin for 1-2 minutes or as long as told by your health care provider. 5. Using a different clean, wet washcloth, thoroughly rinse your entire body. Make sure that all body creases and crevices are rinsed well. 6. Dry off with a clean towel. Do not put any substances on your body afterward--such as powder, lotion, or perfume--unless you are told to do so by your health care provider. Only use lotions that are recommended by the manufacturer. 7. Put on clean clothes or pajamas. 8. If it is the night before your surgery, sleep in clean sheets. How to use CHG prepackaged cloths  Only use CHG cloths as told by your health care provider, and follow the instructions on the label.  Use the CHG cloth on clean, dry skin.  Do not use the CHG cloth on your head or face unless your health care provider tells you to.  When washing with the CHG cloth: ? Avoid your genital area. ? Avoid any areas of skin that have broken skin, cuts, or scrapes. Before surgery Follow these steps when using a CHG cloth to clean before surgery (unless your health care provider gives you different instructions): 1. Using the CHG cloth, vigorously scrub the part of your body where you will be having surgery. Scrub using a back-and-forth motion for 3 minutes. The area on your body should be completely wet with CHG when you are done  scrubbing. 2. Do not rinse. Discard the cloth and let the area air-dry. Do not put any substances on the area afterward, such as powder, lotion, or perfume. 3. Put on clean clothes or pajamas. 4. If it is the night before your surgery, sleep in clean sheets.  For general bathing Follow these steps when using CHG cloths for general bathing (unless your health care provider gives you different instructions). 1. Use a separate CHG cloth for each area of your body. Make sure you wash between any folds of skin and between your fingers and toes. Wash your body in the following order, switching to a new cloth after each step: ? The front of your neck, shoulders, and chest. ? Both of your arms, under your arms, and your hands. ? Your stomach and groin area, avoiding the genitals. ? Your right leg and foot. ? Your left leg and foot. ? The back of your neck, your back, and your buttocks. 2. Do not rinse. Discard the cloth and let the area air-dry. Do not put any substances on your body afterward--such as powder, lotion, or perfume--unless you are told to do so by your health care provider. Only use lotions that are recommended by the manufacturer. 3. Put on clean clothes or pajamas. Contact a health care provider if:  Your skin gets irritated after scrubbing.  You have questions about using your solution or cloth. Get help right away if:  Your eyes become very red or swollen.  Your eyes itch badly.  Your skin itches badly and is red or swollen.  Your hearing changes.  You have trouble seeing.  You have swelling or tingling in your mouth or throat.  You have trouble breathing.  You swallow any chlorhexidine. Summary  Chlorhexidine gluconate (CHG) is a germ-killing (antiseptic) solution that is used to clean the skin. Cleaning your skin with CHG may help to lower your risk for infection.  You may be given CHG to use for bathing. It may be in a bottle or in a prepackaged cloth to use on  your skin. Carefully follow your health care provider's instructions and the instructions on the product label.  Do not use CHG if you have a chlorhexidine allergy.  Contact your health care provider if your skin gets irritated after scrubbing. This information is not intended to replace advice given to you by your health care provider. Make sure you discuss any questions you have with your health care provider. Document Revised: 02/16/2019 Document Reviewed: 10/28/2017 Elsevier Patient Education  [place].

## 2020-02-28 ENCOUNTER — Encounter (HOSPITAL_COMMUNITY): Payer: Self-pay

## 2020-02-28 ENCOUNTER — Encounter (HOSPITAL_COMMUNITY)
Admission: RE | Admit: 2020-02-28 | Discharge: 2020-02-28 | Disposition: A | Discharge status: Home / Self Care | Payer: BC Managed Care – PPO | Source: Ambulatory Visit | Attending: Obstetrics and Gynecology | Admitting: Obstetrics and Gynecology

## 2020-02-28 ENCOUNTER — Telehealth: Payer: Self-pay | Admitting: Obstetrics and Gynecology

## 2020-02-28 ENCOUNTER — Other Ambulatory Visit: Payer: Self-pay

## 2020-02-28 ENCOUNTER — Other Ambulatory Visit: Payer: Self-pay | Admitting: Obstetrics and Gynecology

## 2020-02-28 DIAGNOSIS — I498 Other specified cardiac arrhythmias: Secondary | ICD-10-CM | POA: Diagnosis not present

## 2020-02-28 DIAGNOSIS — Z01818 Encounter for other preprocedural examination: Secondary | ICD-10-CM | POA: Diagnosis not present

## 2020-02-28 DIAGNOSIS — R9431 Abnormal electrocardiogram [ECG] [EKG]: Secondary | ICD-10-CM | POA: Diagnosis not present

## 2020-02-28 LAB — CBC
HCT: 46.3 % — ABNORMAL HIGH (ref 36.0–46.0)
Hemoglobin: 14.5 g/dL (ref 12.0–15.0)
MCH: 29.4 pg (ref 26.0–34.0)
MCHC: 31.3 g/dL (ref 30.0–36.0)
MCV: 93.9 fL (ref 80.0–100.0)
Platelets: 174 10*3/uL (ref 150–400)
RBC: 4.93 MIL/uL (ref 3.87–5.11)
RDW: 13 % (ref 11.5–15.5)
WBC: 7.2 10*3/uL (ref 4.0–10.5)
nRBC: 0 % (ref 0.0–0.2)

## 2020-02-28 LAB — COMPREHENSIVE METABOLIC PANEL
ALT: 26 U/L (ref 0–44)
AST: 20 U/L (ref 15–41)
Albumin: 4.4 g/dL (ref 3.5–5.0)
Alkaline Phosphatase: 79 U/L (ref 38–126)
Anion gap: 7 (ref 5–15)
BUN: 12 mg/dL (ref 6–20)
CO2: 23 mmol/L (ref 22–32)
Calcium: 9 mg/dL (ref 8.9–10.3)
Chloride: 108 mmol/L (ref 98–111)
Creatinine, Ser: 0.59 mg/dL (ref 0.44–1.00)
GFR calc Af Amer: >60 mL/min (ref >60)
GFR calc non Af Amer: >60 mL/min (ref >60)
Glucose, Bld: 89 mg/dL (ref 70–99)
Potassium: 4 mmol/L (ref 3.5–5.1)
Sodium: 138 mmol/L (ref 135–145)
Total Bilirubin: 0.7 mg/dL (ref 0.3–1.2)
Total Protein: 7.3 g/dL (ref 6.5–8.1)

## 2020-02-28 LAB — TYPE AND SCREEN
ABO/RH(D): O POS
Antibody Screen: NEGATIVE

## 2020-02-28 LAB — URINALYSIS, ROUTINE W REFLEX MICROSCOPIC
Bilirubin Urine: NEGATIVE
Glucose, UA: NEGATIVE mg/dL
Ketones, ur: NEGATIVE mg/dL
Nitrite: NEGATIVE
Protein, ur: NEGATIVE mg/dL
RBC / HPF: >50 RBC/hpf — ABNORMAL HIGH (ref 0–5)
Specific Gravity, Urine: 1.014 (ref 1.005–1.030)
pH: 6 (ref 5.0–8.0)

## 2020-02-28 LAB — HCG, SERUM, QUALITATIVE: Preg, Serum: NEGATIVE

## 2020-02-28 NOTE — Telephone Encounter (Signed)
[facility] called, was unavailable. Given cell # to ease f/u.

## 2020-02-28 NOTE — Progress Notes (Signed)
[place] VIRTUAL GYN VISIT ENCOUNTER NOTE Patient name: Emily Hawkins MRN [id number]  Date of birth: September 25, 1961  I connected with patient on 02/28/20 at  4:10 PM EST by phone and verified that I am speaking with the correct person using two identifiers.  Due to COVID-19 recommendations, pt is not currently in the office.    I discussed the limitations, risks, security and privacy concerns of performing an evaluation and management service by telephone and the availability of in person appointments. I also discussed with the patient that there may be a patient responsible charge related to this service. The patient expressed understanding and agreed to proceed.   Chief Complaint:   question about up coming surgery  History of Present Illness:   Emily Hawkins is a 59 y.o. 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     No LMP recorded. Patient has had an ablation. The current method of family planning is post menopausal status.  Last pap normal 01/09/2020. Results were:  normal Review of Systems:   Pertinent items are noted in HPI Denies fever/chills, dizziness, headaches, visual disturbances, fatigue, shortness of breath, chest pain, abdominal pain, vomiting, abnormal vaginal discharge/itching/odor/irritation, problems with periods, bowel movements, urination, or intercourse unless otherwise stated above.  Pertinent History Reviewed:  Reviewed past medical,surgical, social, obstetrical and family history.  Reviewed problem list, medications and allergies. Physical Assessment:  There were no vitals filed for this visit.There is no height or weight on file to calculate BMI.       Physical Examination:   General:  Alert, oriented and cooperative.   Mental Status: Normal mood and affect perceived. Normal judgment and thought content.  Physical exam deferred due to nature of the encounter GYNECOLOGIC  SONOGRAM   [name] is a 59 y.o. [id number] No LMP recorded. Patient has had an ablation. She is here for a pelvic sonogram for preop evaluation,cystocele and rectocele with uterovaginal prolapse.  Uterus                      7.2 x 5.9 x 6.1 cm, Total uterine volume 134 cc, heterogeneous anteverted/retroflexed uterus with mult.fibroids,(#2) fundal subserosal fibroid 3.4 x 2.8 x 3.3 cm,(#2) anterior intramural vs submucosal fibroid 1.7 x 1.8 x 1.7 cm  Endometrium          5.7 mm, symmetrical, complex thickened endometrium  Right ovary             2.4 x 2.2 x 2.8 cm, right ovarian cyst with a thin septation 2.1 x 1.7 x 1.9 cm  Left ovary                1.9 x 1.7 x .9 cm, wnl,unable to slide ovary  No free fluid   Technician Comments:  PELVIC US TA/TV: heterogeneous anteverted/retroflexed uterus with mult.fibroids,(#2) fundal subserosal fibroid 3.4 x 2.8 x 3.3 cm,(#2) anterior intramural vs submucosal fibroid 1.7 x 1.8 x 1.7 cm,complex thickened endometrium 5.7 mm,normal left ovary,right ovarian cyst with a thin septation 2.1 x 1.7 x 1.9 cm,right ovary slides,unable to slide left ovary  Chaperone [address] [name] 02/12/2020 3:43 PM  Clinical Impression and recommendations:  I have reviewed the sonogram results above, combined with the patient's current clinical course, below are my impressions and any appropriate recommendations for management based on the sonographic findings.  Uterus is overall normal size small clinically insignificant fibroids Endometrium is heterogenous in appearance  but no history of PMB, consistent with previous endometrial ablation, no endometrial sampling is indicated in this clinical setting Right ovary with small simple cyst, thin septation, no other associated abnormalities Left ovary is normal   [name] 02/15/2020 7:00 AM     No results found for this or any previous visit (from the past 24 hour(s)).    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   No orders of the defined types were placed in this encounter.   I discussed the assessment and treatment plan with the patient. The patient was provided an opportunity to ask questions and all were answered. The patient agreed with the plan and demonstrated an understanding of the instructions.   The patient was advised to call back or seek an in-person evaluation/go to the ED if the symptoms worsen or if the condition fails to improve as anticipated.  I provided 10 minutes of non-face-to-face time during this encounter.   Will arrange surgery as discussed at last evaluation.  [name] CNM, [facility] 02/28/2020 10:09 AM

## 2020-02-28 NOTE — Progress Notes (Signed)
ECG normal sinus rhythm with minor irregularity in ST segment.

## 2020-02-29 ENCOUNTER — Other Ambulatory Visit: Payer: Self-pay

## 2020-02-29 ENCOUNTER — Telehealth: Payer: Self-pay | Admitting: Obstetrics and Gynecology

## 2020-02-29 ENCOUNTER — Encounter: Payer: Self-pay | Admitting: Obstetrics and Gynecology

## 2020-02-29 ENCOUNTER — Ambulatory Visit: Payer: BC Managed Care – PPO | Admitting: Obstetrics and Gynecology

## 2020-02-29 ENCOUNTER — Other Ambulatory Visit: Payer: Self-pay | Admitting: Obstetrics and Gynecology

## 2020-02-29 VITALS — BP 125/81 | HR 78 | Ht 61.0 in | Wt 147.4 lb

## 2020-02-29 DIAGNOSIS — N939 Abnormal uterine and vaginal bleeding, unspecified: Secondary | ICD-10-CM | POA: Diagnosis not present

## 2020-02-29 DIAGNOSIS — N879 Dysplasia of cervix uteri, unspecified: Secondary | ICD-10-CM | POA: Diagnosis not present

## 2020-02-29 NOTE — Addendum Note (Signed)
Addended by: [name] on: 02/29/2020 05:06 PM   Modules accepted: Level of Service

## 2020-02-29 NOTE — Progress Notes (Signed)
Patient ID: Emily Hawkins, female   DOB: [birth year], 59 y.o.   MRN: [id number]    [facility] Visit  @DATE @            Patient name: Emily Hawkins MRN [id number]  Date of birth: [birth year]  CC & HPI:  Emily Hawkins is a 59 y.o. female presenting today for AUB. She is scheduled for vaginal hysterectomy with bilateral salpingo oophorectomy and anterior/posterior repair on 03/05/2020. She is having this done secondary to second degree uterine descensus, cystocele, and rectocele. She had her preoperative appointment on 01/31/2020 but is requesting evaluation today for the abnormal bleeding. preop labs showed hematuria, from the PMB,and I requested that she be tested.  She is s/p endometrial ablation. Recent Preop U/s showed slight thickening of endometrium to 5+ mm   ROS:  ROS +AUB  Pertinent History Reviewed:   Reviewed Medical         Past Medical History:  Diagnosis Date  . Arthritis   . History of kidney stones   . Hypertension   . Liver laceration   . Migraine   . PONV (postoperative nausea and vomiting)   . Vertigo                               Surgical Hx:    Past Surgical History:  Procedure Laterality Date  . ABDOMINAL SURGERY     lacerated liver repaired  . CARDIAC CATHETERIZATION    . CHOLECYSTECTOMY  10 yrs ago  . EXTRACORPOREAL SHOCK WAVE LITHOTRIPSY Left 04/12/2013   Procedure: EXTRACORPOREAL SHOCK WAVE LITHOTRIPSY (ESWL) LEFT RENAL CALCULUS;  Surgeon: Marissa Nestle, MD;  Location: [facility];  Service: Urology;  Laterality: Left;  . LASER ABLATION     uterus  . NOSE SURGERY    . TUBAL LIGATION     Medications: Reviewed & Updated - see associated section                       Current Outpatient Medications:  .  acetaminophen (TYLENOL) 500 MG tablet, Take 500-1,000 mg by mouth every 6 (six) hours as needed (pain.). , Disp: , Rfl:  .  ALPRAZolam (XANAX) 1 MG tablet, Take 1 mg by mouth daily as needed (anxiety). , Disp: , Rfl:  .  atorvastatin (LIPITOR)  20 MG tablet, Take 20 mg by mouth at bedtime. , Disp: , Rfl:  .  lisinopril (PRINIVIL,ZESTRIL) 10 MG tablet, Take 10 mg by mouth at bedtime. , Disp: , Rfl:  .  valACYclovir (VALTREX) 500 MG tablet, Take 500 mg by mouth daily as needed (outbreaks). , Disp: , Rfl:  .  zonisamide (ZONEGRAN) 25 MG capsule, Take 75 mg by mouth at bedtime., Disp: , Rfl:    Social History: Reviewed -  reports that she has never smoked. She has never used smokeless tobacco.  Objective Findings:  Vitals: Blood pressure 125/81, pulse 78, height 5\' 1"  (1.549 m), weight 147 lb 6.4 oz (66.9 kg).  PHYSICAL EXAMINATION General appearance - alert, well appearing, and in no distress Mental status - normal mood, behavior, speech, dress, motor activity, and thought processes, affect appropriate to mood  PELVIC External genitalia - Normal Cervix - 3 ccs of 1% lidocaine used as numbing agent. Large, bulbous.  Uterus - Second degree uterine descendt. Sounded at 5 cm. Stenosis encountered. Sample taken.    Assessment & Plan:   A:  1.  AUB 2. 2 s/p ablation 3. Pelvic relaxation with cystocele and rectocele.  P:  1. Scheduled for vaginal hysterectomy with bilateral salpingo oophorectomy and anterior/posterior repair on 03/05/2020.  By signing my name below, I, [name], attest that this documentation has been prepared under the direction and in the presence of [name], MD. Electronically Signed: [place]. 02/29/20. 3:03 PM.  I personally performed the services described in this documentation, which was SCRIBED in my presence. The recorded information has been reviewed and considered accurate. It has been edited as necessary during review. [name], MD

## 2020-02-29 NOTE — Telephone Encounter (Signed)
Pt reports bleeding per vagina, which explains the hematuria.  Will do endometrial biopsy today at 3.

## 2020-03-01 ENCOUNTER — Other Ambulatory Visit (HOSPITAL_COMMUNITY): Payer: BC Managed Care – PPO

## 2020-03-04 ENCOUNTER — Other Ambulatory Visit (HOSPITAL_COMMUNITY)
Admission: RE | Admit: 2020-03-04 | Discharge: 2020-03-04 | Disposition: A | Discharge status: Home / Self Care | Payer: BC Managed Care – PPO | Source: Ambulatory Visit | Attending: Obstetrics and Gynecology | Admitting: Obstetrics and Gynecology

## 2020-03-04 ENCOUNTER — Other Ambulatory Visit: Payer: Self-pay

## 2020-03-04 DIAGNOSIS — Z20822 Contact with and (suspected) exposure to covid-19: Secondary | ICD-10-CM | POA: Insufficient documentation

## 2020-03-04 DIAGNOSIS — Z01812 Encounter for preprocedural laboratory examination: Secondary | ICD-10-CM | POA: Insufficient documentation

## 2020-03-04 LAB — SARS CORONAVIRUS 2 (TAT 6-24 HRS): SARS Coronavirus 2: NEGATIVE

## 2020-03-05 ENCOUNTER — Encounter (HOSPITAL_COMMUNITY)
Admission: RE | Disposition: A | Discharge status: Home / Self Care | Payer: Self-pay | Source: Home / Self Care | Attending: Obstetrics and Gynecology

## 2020-03-05 ENCOUNTER — Observation Stay (HOSPITAL_COMMUNITY): Payer: BC Managed Care – PPO | Admitting: Anesthesiology

## 2020-03-05 ENCOUNTER — Other Ambulatory Visit: Payer: Self-pay

## 2020-03-05 ENCOUNTER — Observation Stay (HOSPITAL_COMMUNITY)
Admission: RE | Admit: 2020-03-05 | Discharge: 2020-03-06 | Disposition: A | Discharge status: Home / Self Care | Payer: BC Managed Care – PPO | Attending: Obstetrics and Gynecology | Admitting: Obstetrics and Gynecology

## 2020-03-05 ENCOUNTER — Encounter (HOSPITAL_COMMUNITY): Payer: Self-pay | Admitting: Obstetrics and Gynecology

## 2020-03-05 DIAGNOSIS — N879 Dysplasia of cervix uteri, unspecified: Secondary | ICD-10-CM | POA: Diagnosis not present

## 2020-03-05 DIAGNOSIS — Z9049 Acquired absence of other specified parts of digestive tract: Secondary | ICD-10-CM | POA: Insufficient documentation

## 2020-03-05 DIAGNOSIS — Z882 Allergy status to sulfonamides status: Secondary | ICD-10-CM | POA: Insufficient documentation

## 2020-03-05 DIAGNOSIS — D259 Leiomyoma of uterus, unspecified: Secondary | ICD-10-CM | POA: Diagnosis not present

## 2020-03-05 DIAGNOSIS — Z79899 Other long term (current) drug therapy: Secondary | ICD-10-CM | POA: Diagnosis not present

## 2020-03-05 DIAGNOSIS — Z87442 Personal history of urinary calculi: Secondary | ICD-10-CM | POA: Insufficient documentation

## 2020-03-05 DIAGNOSIS — Z90710 Acquired absence of both cervix and uterus: Secondary | ICD-10-CM | POA: Diagnosis present

## 2020-03-05 DIAGNOSIS — M1990 Unspecified osteoarthritis, unspecified site: Secondary | ICD-10-CM | POA: Insufficient documentation

## 2020-03-05 DIAGNOSIS — Z888 Allergy status to other drugs, medicaments and biological substances status: Secondary | ICD-10-CM | POA: Insufficient documentation

## 2020-03-05 DIAGNOSIS — Z91040 Latex allergy status: Secondary | ICD-10-CM | POA: Insufficient documentation

## 2020-03-05 DIAGNOSIS — I10 Essential (primary) hypertension: Secondary | ICD-10-CM | POA: Diagnosis not present

## 2020-03-05 DIAGNOSIS — N8110 Cystocele, unspecified: Secondary | ICD-10-CM | POA: Diagnosis not present

## 2020-03-05 DIAGNOSIS — Z880 Allergy status to penicillin: Secondary | ICD-10-CM | POA: Insufficient documentation

## 2020-03-05 DIAGNOSIS — N816 Rectocele: Secondary | ICD-10-CM | POA: Diagnosis not present

## 2020-03-05 DIAGNOSIS — N812 Incomplete uterovaginal prolapse: Secondary | ICD-10-CM | POA: Diagnosis not present

## 2020-03-05 DIAGNOSIS — Z881 Allergy status to other antibiotic agents status: Secondary | ICD-10-CM | POA: Diagnosis not present

## 2020-03-05 DIAGNOSIS — N8111 Cystocele, midline: Secondary | ICD-10-CM | POA: Diagnosis not present

## 2020-03-05 DIAGNOSIS — Z885 Allergy status to narcotic agent status: Secondary | ICD-10-CM | POA: Insufficient documentation

## 2020-03-05 HISTORY — PX: ANTERIOR AND POSTERIOR REPAIR: SHX5121

## 2020-03-05 HISTORY — PX: VAGINAL HYSTERECTOMY: SHX2639

## 2020-03-05 SURGERY — HYSTERECTOMY, VAGINAL
Anesthesia: General

## 2020-03-05 MED ORDER — SODIUM CHLORIDE 0.9% FLUSH: 9.0000 mL | INTRAVENOUS | Status: DC | PRN

## 2020-03-05 MED ORDER — LIDOCAINE 2% (20 MG/ML) 5 ML SYRINGE
INTRAMUSCULAR | Status: AC
  Filled 2020-03-05: qty 5

## 2020-03-05 MED ORDER — FENTANYL CITRATE (PF) 100 MCG/2ML IJ SOLN
INTRAMUSCULAR | Status: DC | PRN
  Administered 2020-03-05: 100 ug via INTRAVENOUS
  Administered 2020-03-05: 50 ug via INTRAVENOUS
  Administered 2020-03-05: 100 ug via INTRAVENOUS

## 2020-03-05 MED ORDER — LISINOPRIL 10 MG PO TABS
10.0000 mg | ORAL_TABLET | Freq: Every day | ORAL | Status: DC
  Administered 2020-03-05: 10 mg via ORAL
  Filled 2020-03-05: qty 1

## 2020-03-05 MED ORDER — ONDANSETRON HCL 4 MG/2ML IJ SOLN
4.0000 mg | Freq: Four times a day (QID) | INTRAMUSCULAR | Status: DC | PRN
  Filled 2020-03-05: qty 2

## 2020-03-05 MED ORDER — HYDROMORPHONE HCL 1 MG/ML IJ SOLN
0.2500 mg | INTRAMUSCULAR | Status: DC | PRN
  Administered 2020-03-05 (×2): 0.5 mg via INTRAVENOUS
  Filled 2020-03-05 (×2): qty 0.5

## 2020-03-05 MED ORDER — ONDANSETRON HCL 4 MG/2ML IJ SOLN
INTRAMUSCULAR | Status: AC
  Filled 2020-03-05: qty 2

## 2020-03-05 MED ORDER — ROCURONIUM BROMIDE 100 MG/10ML IV SOLN
INTRAVENOUS | Status: DC | PRN
  Administered 2020-03-05: 5 mg via INTRAVENOUS
  Administered 2020-03-05: 45 mg via INTRAVENOUS

## 2020-03-05 MED ORDER — DIPHENHYDRAMINE HCL 50 MG/ML IJ SOLN: 12.5000 mg | Freq: Four times a day (QID) | INTRAMUSCULAR | Status: DC | PRN

## 2020-03-05 MED ORDER — LACTATED RINGERS IV SOLN
INTRAVENOUS | Status: DC
  Administered 2020-03-05: 1000 mL via INTRAVENOUS

## 2020-03-05 MED ORDER — SUCCINYLCHOLINE CHLORIDE 200 MG/10ML IV SOSY
PREFILLED_SYRINGE | INTRAVENOUS | Status: DC | PRN
  Administered 2020-03-05: 100 mg via INTRAVENOUS

## 2020-03-05 MED ORDER — DEXAMETHASONE SODIUM PHOSPHATE 10 MG/ML IJ SOLN
INTRAMUSCULAR | Status: AC
  Filled 2020-03-05: qty 1

## 2020-03-05 MED ORDER — ONDANSETRON HCL 4 MG PO TABS: 4.0000 mg | ORAL_TABLET | Freq: Four times a day (QID) | ORAL | Status: DC | PRN

## 2020-03-05 MED ORDER — SUGAMMADEX SODIUM 500 MG/5ML IV SOLN
INTRAVENOUS | Status: DC | PRN
  Administered 2020-03-05: 200 mg via INTRAVENOUS

## 2020-03-05 MED ORDER — 0.9 % SODIUM CHLORIDE (POUR BTL) OPTIME
TOPICAL | Status: DC | PRN
  Administered 2020-03-05: 1000 mL

## 2020-03-05 MED ORDER — CEFAZOLIN SODIUM-DEXTROSE 2-4 GM/100ML-% IV SOLN
2.0000 g | INTRAVENOUS | Status: AC
  Administered 2020-03-05: 2 g via INTRAVENOUS

## 2020-03-05 MED ORDER — PANTOPRAZOLE SODIUM 40 MG PO TBEC
40.0000 mg | DELAYED_RELEASE_TABLET | Freq: Every day | ORAL | Status: DC
  Filled 2020-03-05: qty 1

## 2020-03-05 MED ORDER — FENTANYL CITRATE (PF) 250 MCG/5ML IJ SOLN
INTRAMUSCULAR | Status: AC
  Filled 2020-03-05: qty 5

## 2020-03-05 MED ORDER — ENOXAPARIN SODIUM 40 MG/0.4ML ~~LOC~~ SOLN
40.0000 mg | SUBCUTANEOUS | Status: DC
  Filled 2020-03-05: qty 0.4

## 2020-03-05 MED ORDER — GABAPENTIN 100 MG PO CAPS
100.0000 mg | ORAL_CAPSULE | Freq: Three times a day (TID) | ORAL | Status: AC
  Administered 2020-03-05 – 2020-03-06 (×3): 100 mg via ORAL
  Filled 2020-03-05 (×3): qty 1

## 2020-03-05 MED ORDER — DIPHENHYDRAMINE HCL 12.5 MG/5ML PO ELIX: 12.5000 mg | ORAL_SOLUTION | Freq: Four times a day (QID) | ORAL | Status: DC | PRN

## 2020-03-05 MED ORDER — DOCUSATE SODIUM 100 MG PO CAPS
100.0000 mg | ORAL_CAPSULE | Freq: Two times a day (BID) | ORAL | Status: DC
  Administered 2020-03-05 – 2020-03-06 (×3): 100 mg via ORAL
  Filled 2020-03-05 (×3): qty 1

## 2020-03-05 MED ORDER — HYDROMORPHONE 1 MG/ML IV SOLN
INTRAVENOUS | Status: DC
  Filled 2020-03-05: qty 25
  Filled 2020-03-05: qty 30

## 2020-03-05 MED ORDER — MIDAZOLAM HCL 2 MG/2ML IJ SOLN
INTRAMUSCULAR | Status: AC
  Filled 2020-03-05: qty 2

## 2020-03-05 MED ORDER — SUCCINYLCHOLINE CHLORIDE 200 MG/10ML IV SOSY
PREFILLED_SYRINGE | INTRAVENOUS | Status: AC
  Filled 2020-03-05: qty 10

## 2020-03-05 MED ORDER — ONDANSETRON HCL 4 MG/2ML IJ SOLN
4.0000 mg | Freq: Four times a day (QID) | INTRAMUSCULAR | Status: DC | PRN
  Administered 2020-03-05 (×2): 4 mg via INTRAVENOUS

## 2020-03-05 MED ORDER — ONDANSETRON HCL 4 MG/2ML IJ SOLN
INTRAMUSCULAR | Status: DC | PRN
  Administered 2020-03-05: 4 mg via INTRAVENOUS

## 2020-03-05 MED ORDER — DEXAMETHASONE SODIUM PHOSPHATE 10 MG/ML IJ SOLN
INTRAMUSCULAR | Status: DC | PRN
  Administered 2020-03-05: 6 mg via INTRAVENOUS

## 2020-03-05 MED ORDER — PROPOFOL 10 MG/ML IV BOLUS
INTRAVENOUS | Status: AC
  Filled 2020-03-05: qty 20

## 2020-03-05 MED ORDER — STERILE WATER FOR IRRIGATION IR SOLN
Status: DC | PRN
  Administered 2020-03-05: 1000 mL

## 2020-03-05 MED ORDER — ROCURONIUM BROMIDE 10 MG/ML (PF) SYRINGE
PREFILLED_SYRINGE | INTRAVENOUS | Status: AC
  Filled 2020-03-05: qty 10

## 2020-03-05 MED ORDER — PROPOFOL 10 MG/ML IV BOLUS
INTRAVENOUS | Status: DC | PRN
  Administered 2020-03-05: 200 mg via INTRAVENOUS

## 2020-03-05 MED ORDER — SODIUM CHLORIDE 0.9 % IR SOLN
Status: DC | PRN
  Administered 2020-03-05: 3000 mL

## 2020-03-05 MED ORDER — KETOROLAC TROMETHAMINE 30 MG/ML IJ SOLN
INTRAMUSCULAR | Status: AC
  Filled 2020-03-05: qty 1

## 2020-03-05 MED ORDER — KETOROLAC TROMETHAMINE 30 MG/ML IJ SOLN
INTRAMUSCULAR | Status: DC | PRN
  Administered 2020-03-05: 30 mg via INTRAVENOUS

## 2020-03-05 MED ORDER — BUPIVACAINE-EPINEPHRINE (PF) 0.5% -1:200000 IJ SOLN
INTRAMUSCULAR | Status: AC
  Filled 2020-03-05: qty 60

## 2020-03-05 MED ORDER — CEFAZOLIN SODIUM-DEXTROSE 2-4 GM/100ML-% IV SOLN
INTRAVENOUS | Status: AC
  Filled 2020-03-05: qty 100

## 2020-03-05 MED ORDER — NALOXONE HCL 0.4 MG/ML IJ SOLN: 0.4000 mg | INTRAMUSCULAR | Status: DC | PRN

## 2020-03-05 MED ORDER — BUPIVACAINE-EPINEPHRINE 0.5% -1:200000 IJ SOLN
INTRAMUSCULAR | Status: DC | PRN
  Administered 2020-03-05: 3 mL
  Administered 2020-03-05: 10 mL
  Administered 2020-03-05: 4 mL
  Administered 2020-03-05: 6 mL

## 2020-03-05 MED ORDER — ZOLPIDEM TARTRATE 5 MG PO TABS: 5.0000 mg | ORAL_TABLET | Freq: Every evening | ORAL | Status: DC | PRN

## 2020-03-05 MED ORDER — MIDAZOLAM HCL 5 MG/5ML IJ SOLN
INTRAMUSCULAR | Status: DC | PRN
  Administered 2020-03-05: 2 mg via INTRAVENOUS

## 2020-03-05 MED ORDER — LIDOCAINE HCL (CARDIAC) PF 100 MG/5ML IV SOSY
PREFILLED_SYRINGE | INTRAVENOUS | Status: DC | PRN
  Administered 2020-03-05: 60 mg via INTRAVENOUS

## 2020-03-05 SURGICAL SUPPLY — 40 items
CLOTH BEACON ORANGE TIMEOUT ST (SAFETY) ×3 IMPLANT
COVER LIGHT HANDLE STERIS (MISCELLANEOUS) ×6 IMPLANT
COVER WAND RF STERILE (DRAPES) ×3 IMPLANT
DECANTER SPIKE VIAL GLASS SM (MISCELLANEOUS) ×3 IMPLANT
DRAPE HALF SHEET 40X57 (DRAPES) ×3 IMPLANT
DRAPE STERI URO 9X17 APER PCH (DRAPES) ×3 IMPLANT
ELECT REM PT RETURN 9FT ADLT (ELECTROSURGICAL) ×3
ELECTRODE REM PT RTRN 9FT ADLT (ELECTROSURGICAL) ×2 IMPLANT
GAUZE 4X4 16PLY RFD (DISPOSABLE) ×4 IMPLANT
GLOVE BIOGEL PI IND STRL 6.5 (GLOVE) IMPLANT
GLOVE BIOGEL PI IND STRL 7.0 (GLOVE) ×6 IMPLANT
GLOVE BIOGEL PI IND STRL 9 (GLOVE) ×2 IMPLANT
GLOVE BIOGEL PI INDICATOR 6.5 (GLOVE) ×1
GLOVE BIOGEL PI INDICATOR 7.0 (GLOVE) ×3
GLOVE BIOGEL PI INDICATOR 9 (GLOVE) ×1
GLOVE SS PI 9.0 STRL (GLOVE) ×2 IMPLANT
GLOVE SURG SS PI 6.5 STRL IVOR (GLOVE) ×2 IMPLANT
GOWN SPEC L3 XXLG W/TWL (GOWN DISPOSABLE) ×3 IMPLANT
GOWN STRL REUS W/TWL LRG LVL3 (GOWN DISPOSABLE) ×6 IMPLANT
IV NS IRRIG 3000ML ARTHROMATIC (IV SOLUTION) ×3 IMPLANT
KIT BLADEGUARD II DBL (SET/KITS/TRAYS/PACK) ×1 IMPLANT
KIT TURNOVER CYSTO (KITS) ×3 IMPLANT
MANIFOLD NEPTUNE II (INSTRUMENTS) ×3 IMPLANT
NDL HYPO 25X1 1.5 SAFETY (NEEDLE) ×2 IMPLANT
NEEDLE HYPO 25X1 1.5 SAFETY (NEEDLE) ×3 IMPLANT
NS IRRIG 1000ML POUR BTL (IV SOLUTION) ×3 IMPLANT
PACK PERI GYN (CUSTOM PROCEDURE TRAY) ×3 IMPLANT
PAD ARMBOARD 7.5X6 YLW CONV (MISCELLANEOUS) ×3 IMPLANT
SET BASIN LINEN APH (SET/KITS/TRAYS/PACK) ×3 IMPLANT
SURGILUBE 2OZ TUBE FLIPTOP (MISCELLANEOUS) ×1 IMPLANT
SUT CHROMIC 0 CT 1 (SUTURE) ×29 IMPLANT
SUT CHROMIC 2 0 CT 1 (SUTURE) ×5 IMPLANT
SUT CHROMIC 2 0 SH (SUTURE) ×1 IMPLANT
SUT PROLENE 2 0 SH 30 (SUTURE) ×1 IMPLANT
SUT VIC AB 0 CT2 8-18 (SUTURE) ×3 IMPLANT
SYR CONTROL 10ML LL (SYRINGE) ×3 IMPLANT
TRAY FOL W/BAG SLVR 16FR STRL (SET/KITS/TRAYS/PACK) IMPLANT
TRAY FOLEY W/BAG SLVR 16FR LF (SET/KITS/TRAYS/PACK) ×3
VERSALIGHT (MISCELLANEOUS) ×3 IMPLANT
WATER STERILE IRR 1000ML POUR (IV SOLUTION) ×3 IMPLANT

## 2020-03-05 NOTE — Anesthesia Procedure Notes (Signed)
Procedure Name: Intubation Date/Time: 03/05/2020 9:20 AM Performed by: Jonna Munro, CRNA Pre-anesthesia Checklist: Patient identified, Emergency Drugs available, Suction available, Patient being monitored and Timeout performed Patient Re-evaluated:Patient Re-evaluated prior to induction Oxygen Delivery Method: Circle system utilized Preoxygenation: Pre-oxygenation with 100% oxygen Induction Type: IV induction and Rapid sequence Laryngoscope Size: Mac and 3 Grade View: Grade I Tube type: Oral Tube size: 7.0 mm Number of attempts: 1 Airway Equipment and Method: Stylet Placement Confirmation: ETT inserted through vocal cords under direct vision,  positive ETCO2 and breath sounds checked- equal and bilateral Secured at: 22 cm Tube secured with: Tape Dental Injury: Teeth and Oropharynx as per pre-operative assessment

## 2020-03-05 NOTE — Brief Op Note (Addendum)
03/05/2020  11:36 AM  PATIENT:  Emily Hawkins  59 y.o. female  PRE-OPERATIVE DIAGNOSIS:  Cystocele Rectocele, second-degree uterine descensus  POST-OPERATIVE DIAGNOSIS:  Cystocele Rectocele second-degree uterine descensus  PROCEDURE:  Procedure(s): HYSTERECTOMY VAGINAL (Bilateral) ANTERIOR (CYSTOCELE) AND POSTERIOR REPAIR (RECTOCELE) (N/A)  SURGEON:  Surgeon(s) and Role:    [name], MD - Primary  PHYSICIAN ASSISTANT:   ASSISTANTS: Zoila Shutter, CST, Kylie ____, CST  ANESTHESIA:   local and general  EBL:  50 mL   BLOOD ADMINISTERED:none  DRAINS: Urinary Catheter (Foley) also Betadine soaked vaginal pack 1 inch tape LOCAL MEDICATIONS USED:  MARCAINE    and Amount: 20 ml  SPECIMEN:  Source of Specimen:  Uterus and cervix  DISPOSITION OF SPECIMEN:  PATHOLOGY  COUNTS:  YES and confirmed with postoperative wand  TOURNIQUET:  * No tourniquets in log *  DICTATION: .Dragon Dictation  PLAN OF CARE: Admit for overnight observation  PATIENT DISPOSITION:  PACU - hemodynamically stable.   Delay start of Pharmacological VTE agent (>24hrs) due to surgical blood loss or risk of bleeding: not applicable Details of procedure: Patient was taken the operating room prepped and draped for vaginal procedure with legs in candycane stirrups, abdomen and perineum prepped and draped with procedure confirmed by operative team during timeout.  Ancef 2 g was taken administered. Procedure began by grasping the cervix with Lahey thyroid tenaculum, circumscribing the cervix from 8:00 to 4:00 at the cervical vaginal junction with sharp dissection, then sharply dissecting and underneath the bladder and identifying the vesicovaginal potential space and dissecting upward to where the anterior peritoneum could be identified.  Posterior colpotomy was then performed without difficulty.  Uterosacral ligaments were then grasped laterally with Zeppelin clamp beginning on the left side clamping cutting  and suture ligating using 0 chromic.  Lower cardinal ligament was then clamped cut and suture-ligated on patient left side.  We then moved to the right side and did the similar process at this point we could see the peritoneum anteriorly and posteriorly.  The long weighted curved speculum was in place at this time, and we marched up the upper cardinal ligaments and broad ligament clamping cutting and ligating and small bites were utilized.  All sutures at this point were 0 chromic At this point the adnexa were inspected.  Upon reaching the level of the round ligament and utero-ovarian ligaments on each side we were able to crossclamped with 1 clamps transected and tagged the utero-ovarian and round ligament pedicle.  Inspection showed that there was epiploic fat attachments over the left tube and ovary.  As per discussion with the patient before the surgery the decision was made to leave tube and ovary in situ Foley catheter was inserted obtaining clear urine .  The right ovary could be visualized was quite petite atrophic.  And well supported.  Again this was left in situ.  Peritoneum was closed using 2-0 chromic in a horizontal running fashion pulling the anterior bladder flap edge back to the posterior colpotomy incision from uterosacral ligament to opposite uterosacral ligament sponge and needle counts were correct.  Vaginal cuff was closed with front to back interrupted 0 chromic sutures. Cystocele repair (anterior repair) the anterior cystocele was then identified it did not extend quite up to the cuff which had been closed and a midline portion of the cystocele was then opened in the midline, sharply dissected off the underlying bladder. .  A series of 3 interrupted horizontal mattress sutures were placed on  the underside of the mobilized epithelium eliminating the cystocele bulge, and then the redundant vaginal epithelium was trimmed and closed in the midline with a running 2-0 chromic  suture.  Posterior repair: Underneath the vaginal bib, a double gloved index finger was used to inspect the perineal body which was quite lax in the lower half of the vagina.  Gloves were changed and then Marcaine injected into the thin rectovaginal septum allowing dissection up the midline for depth of about 4 cm and sharply dissecting off the rectovaginal septum which included seeing some varicosities.  At no time was any suspicion of the bowel was entered. It was identified that there was more tissue on the patient's left side and that the rectocele defect was considered to be to the right of the midline.  The redundant tissue of the rectovaginal septum on the patient left side was pulled over and cephalad and attached with a series of 4 interrupted horizontal mattress sutures of 0 Vicryl reinforcing the perineal body.  2 additional sutures were placed thickening of the perineum at the level of the introitus.  The upper first layer suture was taken out as it was considered to be snagging the introitus too much.  The vaginal epithelium was then closed in a running locking fashion and post procedure examination allowed 2 fingers in the introitus without restriction. Vaginal packing with Betadine and K-Y jelly soaked 1 inch tape was then performed and patient allowed to go to recovery room in stable condition

## 2020-03-05 NOTE — Anesthesia Preprocedure Evaluation (Signed)
Anesthesia Evaluation  Patient identified by MRN, date of birth, ID band Patient awake    Reviewed: Allergy & Precautions, NPO status , Patient's Chart, lab work & pertinent test results, reviewed documented beta blocker date and time   History of Anesthesia Complications (+) PONV and history of anesthetic complications  Airway Mallampati: II  TM Distance: >3 FB Neck ROM: full    Dental no notable dental hx.    Pulmonary neg pulmonary ROS,    Pulmonary exam normal        Cardiovascular hypertension, Normal cardiovascular exam     Neuro/Psych  Headaches, negative psych ROS   GI/Hepatic negative GI ROS, Neg liver ROS,   Endo/Other  negative endocrine ROS  Renal/GU negative Renal ROS  negative genitourinary   Musculoskeletal  (+) Arthritis ,   Abdominal   Peds  Hematology negative hematology ROS (+)   Anesthesia Other Findings   Reproductive/Obstetrics negative OB ROS                             Anesthesia Physical Anesthesia Plan  ASA: II  Anesthesia Plan: General   Post-op Pain Management:  Regional for Post-op pain   Induction:   PONV Risk Score and Plan: 2 and Ondansetron  Airway Management Planned:   Additional Equipment:   Intra-op Plan:   Post-operative Plan:   Informed Consent: I have reviewed the patients History and Physical, chart, labs and discussed the procedure including the risks, benefits and alternatives for the proposed anesthesia with the patient or authorized representative who has indicated his/her understanding and acceptance.       Plan Discussed with: CRNA  Anesthesia Plan Comments:         Anesthesia Quick Evaluation

## 2020-03-05 NOTE — Transfer of Care (Signed)
Immediate Anesthesia Transfer of Care Note  Patient: Emily Hawkins  Procedure(s) Performed: HYSTERECTOMY VAGINAL (Bilateral ) ANTERIOR (CYSTOCELE) AND POSTERIOR REPAIR (RECTOCELE) (N/A )  Patient Location: PACU  Anesthesia Type:General  Level of Consciousness: awake, alert , oriented and patient cooperative  Airway & Oxygen Therapy: Patient Spontanous Breathing and Patient connected to face mask oxygen  Post-op Assessment: Report given to RN, Post -op Vital signs reviewed and stable and Patient moving all extremities X 4  Post vital signs: Reviewed and stable  Last Vitals:  Vitals Value Taken Time  BP    Temp    Pulse    Resp    SpO2      Last Pain:  Vitals:   03/05/20 0816  TempSrc: (P) Oral  PainSc: (P) 0-No pain      Patients Stated Pain Goal: (P) 8 ([id number])  Complications: No apparent anesthesia complications

## 2020-03-05 NOTE — Op Note (Signed)
03/05/2020  11:36 AM  PATIENT:  Emily Hawkins  59 y.o. female  PRE-OPERATIVE DIAGNOSIS:  Cystocele Rectocele, second-degree uterine descensus  POST-OPERATIVE DIAGNOSIS:  Cystocele Rectocele second-degree uterine descensus  PROCEDURE:  Procedure(s): HYSTERECTOMY VAGINAL (Bilateral) ANTERIOR (CYSTOCELE) AND POSTERIOR REPAIR (RECTOCELE) (N/A)  SURGEON:  Surgeon(s) and Role:    [name], MD - Primary  PHYSICIAN ASSISTANT:   ASSISTANTS: Zoila Shutter, CST, Kylie ____, CST  ANESTHESIA:   local and general  EBL:  50 mL   BLOOD ADMINISTERED:none  DRAINS: Urinary Catheter (Foley) also Betadine soaked vaginal pack 1 inch tape LOCAL MEDICATIONS USED:  MARCAINE    and Amount: 20 ml  SPECIMEN:  Source of Specimen:  Uterus and cervix  DISPOSITION OF SPECIMEN:  PATHOLOGY  COUNTS:  YES and confirmed with postoperative wand  TOURNIQUET:  * No tourniquets in log *  DICTATION: .Dragon Dictation  PLAN OF CARE: Admit for overnight observation  PATIENT DISPOSITION:  PACU - hemodynamically stable.   Delay start of Pharmacological VTE agent (>24hrs) due to surgical blood loss or risk of bleeding: not applicable Details of procedure: Patient was taken the operating room prepped and draped for vaginal procedure with legs in candycane stirrups, abdomen and perineum prepped and draped with procedure confirmed by operative team during timeout.  Ancef 2 g was taken administered. Procedure began by grasping the cervix with Lahey thyroid tenaculum, circumscribing the cervix from 8:00 to 4:00 at the cervical vaginal junction with sharp dissection, then sharply dissecting and underneath the bladder and identifying the vesicovaginal potential space and dissecting upward to where the anterior peritoneum could be identified.  Posterior colpotomy was then performed without difficulty.  Uterosacral ligaments were then grasped laterally with Zeppelin clamp beginning on the left side clamping cutting  and suture ligating using 0 chromic.  Lower cardinal ligament was then clamped cut and suture-ligated on patient left side.  We then moved to the right side and did the similar process at this point we could see the peritoneum anteriorly and posteriorly.  The long weighted curved speculum was in place at this time, and we marched up the upper cardinal ligaments and broad ligament clamping cutting and ligating and small bites were utilized.  All sutures at this point were 0 chromic At this point the adnexa were inspected.  Upon reaching the level of the round ligament and utero-ovarian ligaments on each side we were able to crossclamped with 1 clamps transected and tagged the utero-ovarian and round ligament pedicle.  Inspection showed that there was epiploic fat attachments over the left tube and ovary.  As per discussion with the patient before the surgery the decision was made to leave tube and ovary in situ Foley catheter was inserted obtaining clear urine .  The right ovary could be visualized was quite petite atrophic.  And well supported.  Again this was left in situ.  Peritoneum was closed using 2-0 chromic in a horizontal running fashion pulling the anterior bladder flap edge back to the posterior colpotomy incision from uterosacral ligament to opposite uterosacral ligament sponge and needle counts were correct.  Vaginal cuff was closed with front to back interrupted 0 chromic sutures. Cystocele repair (anterior repair) the anterior cystocele was then identified it did not extend quite up to the cuff which had been closed and a midline portion of the cystocele was then opened in the midline, sharply dissected off the underlying bladder. .  A series of 3 interrupted horizontal mattress sutures were placed on  the underside of the mobilized epithelium eliminating the cystocele bulge, and then the redundant vaginal epithelium was trimmed and closed in the midline with a running 2-0 chromic  suture.  Posterior repair: Underneath the vaginal bib, a double gloved index finger was used to inspect the perineal body which was quite lax in the lower half of the vagina.  Gloves were changed and then Marcaine injected into the thin rectovaginal septum allowing dissection up the midline for depth of about 4 cm and sharply dissecting off the rectovaginal septum which included seeing some varicosities.  At no time was any suspicion of the bowel was entered. It was identified that there was more tissue on the patient's left side and that the rectocele defect was considered to be to the right of the midline.  The redundant tissue of the rectovaginal septum on the patient left side was pulled over and cephalad and attached with a series of 4 interrupted horizontal mattress sutures of 0 Vicryl reinforcing the perineal body.  2 additional sutures were placed thickening of the perineum at the level of the introitus.  The upper first layer suture was taken out as it was considered to be snagging the introitus too much.  The vaginal epithelium was then closed in a running locking fashion and post procedure examination allowed 2 fingers in the introitus without restriction. Vaginal packing with Betadine and K-Y jelly soaked 1 inch tape was then performed and patient allowed to go to recovery room in stable condition

## 2020-03-05 NOTE — Anesthesia Postprocedure Evaluation (Signed)
Anesthesia Post Note  Patient: Emily Hawkins  Procedure(s) Performed: HYSTERECTOMY VAGINAL (Bilateral ) ANTERIOR (CYSTOCELE) AND POSTERIOR REPAIR (RECTOCELE) (N/A )  Patient location during evaluation: PACU Anesthesia Type: General Level of consciousness: awake, oriented, awake and alert and patient cooperative Pain management: pain level controlled Vital Signs Assessment: post-procedure vital signs reviewed and stable Respiratory status: spontaneous breathing, respiratory function stable, nonlabored ventilation and patient connected to face mask oxygen Cardiovascular status: stable Postop Assessment: no apparent nausea or vomiting Anesthetic complications: no     Last Vitals:  Vitals:   03/05/20 0816  Resp: (P) 19  Temp: (P) 36.8 C  SpO2: (P) 96%    Last Pain:  Vitals:   03/05/20 0816  TempSrc: (P) Oral  PainSc: (P) 0-No pain                 Trezure Cronk

## 2020-03-05 NOTE — H&P (Addendum)
Patient ID: Emily Hawkins, female   DOB: 30-Aug-1961, 59 y.o.   MRN: [id number]  Preoperative History and Physical  Emily Hawkins is a 59 y.o. 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  No significant preoperative concerns. She is s/p ablation. She had a small episode of vaginal bleeding recently, and endometrial biopsy was attempted last week. The sample was primarily endocervical sample due to limitations of sampling believed due to scarring after endometrial ablation. The sample was benign endocervical sample.  Proposed surgery: vaginal hysterectomy with anterior and posterior repair. ,bilateral salpingo oophorectomy      Past Medical History:  Diagnosis Date  . Arthritis   . History of kidney stones   . Hypertension   . Liver laceration   . Migraine   . PONV (postoperative nausea and vomiting)   . Vertigo         Past Surgical History:  Procedure Laterality Date  . ABDOMINAL SURGERY     lacerated liver repaired  . CARDIAC CATHETERIZATION    . CHOLECYSTECTOMY  10 yrs ago  . EXTRACORPOREAL SHOCK WAVE LITHOTRIPSY Left 04/12/2013   Procedure: EXTRACORPOREAL SHOCK WAVE LITHOTRIPSY (ESWL) LEFT RENAL CALCULUS;  Surgeon: Marissa Nestle, MD;  Location: [facility];  Service: Urology;  Laterality: Left;  . LASER ABLATION     uterus  . NOSE SURGERY    . TUBAL LIGATION                     OB History  Gravida Para Term Preterm AB Living  2 2 2     2   SAB TAB Ectopic Multiple Live Births             2       # Outcome Date GA Lbr Len/2nd Weight Sex Delivery Anes PTL Lv  2 Term     F Vag-Spont   LIV  1 Term     M Vag-Spont   LIV  Patient denies any other pertinent gynecologic issues.         Current Outpatient Medications on File Prior to Visit  Medication Sig Dispense Refill  . acetaminophen (TYLENOL) 500 MG tablet Take by mouth.    . ALPRAZolam (XANAX) 1 MG tablet     . atorvastatin  (LIPITOR) 20 MG tablet Take 20 mg by mouth daily.    Marland Kitchen lisinopril (PRINIVIL,ZESTRIL) 10 MG tablet Take 10 mg by mouth daily.     No current facility-administered medications on file prior to visit.        Allergies  Allergen Reactions  . Flagyl [Metronidazole Hcl] Other (See Comments)    Unknown reaction  . Latex Hives and Itching  . Oxycodone Nausea And Vomiting and Other (See Comments)    vertigo  . Penicillins Hives and Itching  . Sulfa Antibiotics Hives and Itching    Social History:   reports that she has never smoked. She has never used smokeless tobacco. She reports that she does not drink alcohol or use drugs.       Family History  Problem Relation Age of Onset  . Hypertension Sister   . Asthma Sister     Review of Systems: Noncontributory  PHYSICAL EXAM: Blood pressure (!) 142/87, pulse 63, height 5\' 1"  (1.549 m), weight 150 lb (68 kg). General appearance - alert, well appearing, and in no distress Chest - clear to auscultation, no wheezes, rales or rhonchi, symmetric air entry Heart - normal rate  and regular rhythm Abdomen - soft, nontender, nondistended, no masses or organomegaly Pelvic -  VAGINA: normal appearing vagina with moderate cystocele not reaching hymen ring. CERVIX: normal appearing cervix  UTERUS:2nd degree uterine descensus to within 2 cm of hymen RECTAL: rectal  bulge.  Extremities - peripheral pulses normal, no pedal edema, no clubbing or cyanosis  Labs: CBC    Component Value Date/Time   WBC 7.2 02/28/2020 1455   RBC 4.93 02/28/2020 1455   HGB 14.5 02/28/2020 1455   HCT 46.3 (H) 02/28/2020 1455   PLT 174 02/28/2020 1455   MCV 93.9 02/28/2020 1455   MCH 29.4 02/28/2020 1455   MCHC 31.3 02/28/2020 1455   RDW 13.0 02/28/2020 1455   LYMPHSABS 2.5 04/03/2013 1813   MONOABS 0.8 04/03/2013 1813   EOSABS 0.1 04/03/2013 1813   BASOSABS 0.0 04/03/2013 1813   CMP Latest Ref Rng & Units 02/28/2020 01/24/2016 04/03/2013   Glucose 70 - 99 mg/dL 89 90 100(H)  BUN 6 - 20 mg/dL 12 8 9   Creatinine 0.44 - 1.00 mg/dL 0.59 0.59 0.58  Sodium 135 - 145 mmol/L 138 138 138  Potassium 3.5 - 5.1 mmol/L 4.0 3.9 3.7  Chloride 98 - 111 mmol/L 108 104 101  CO2 22 - 32 mmol/L 23 25 27   Calcium 8.9 - 10.3 mg/dL 9.0 9.3 10.0  Total Protein 6.5 - 8.1 g/dL 7.3 - -  Total Bilirubin 0.3 - 1.2 mg/dL 0.7 - -  Alkaline Phos 38 - 126 U/L 79 - -  AST 15 - 41 U/L 20 - -  ALT 0 - 44 U/L 26 - -     Imaging Studies:   GYNECOLOGIC SONOGRAM   Emily Hawkins is a 59 y.o. [id number] No LMP recorded. Patient has had an ablation. She is here for a pelvic sonogram for preop evaluation,cystocele and rectocele with uterovaginal prolapse.  Uterus                      7.2 x 5.9 x 6.1 cm, Total uterine volume 134 cc, heterogeneous anteverted/retroflexed uterus with mult.fibroids,(#2) fundal subserosal fibroid 3.4 x 2.8 x 3.3 cm,(#2) anterior intramural vs submucosal fibroid 1.7 x 1.8 x 1.7 cm  Endometrium          5.7 mm, symmetrical, complex thickened endometrium  Right ovary             2.4 x 2.2 x 2.8 cm, right ovarian cyst with a thin septation 2.1 x 1.7 x 1.9 cm  Left ovary                1.9 x 1.7 x .9 cm, wnl,unable to slide ovary  No free fluid   Technician Comments:  PELVIC US TA/TV: heterogeneous anteverted/retroflexed uterus with mult.fibroids,(#2) fundal subserosal fibroid 3.4 x 2.8 x 3.3 cm,(#2) anterior intramural vs submucosal fibroid 1.7 x 1.8 x 1.7 cm,complex thickened endometrium 5.7 mm,normal left ovary,right ovarian cyst with a thin septation 2.1 x 1.7 x 1.9 cm,right ovary slides,unable to slide left ovary  Chaperone [address] [name] 02/12/2020 3:43 PM  Clinical Impression and recommendations:  I have reviewed the sonogram results above, combined with the patient's current clinical course, below are my impressions and any appropriate recommendations for management based on the sonographic  findings.  Uterus is overall normal size small clinically insignificant fibroids Endometrium is heterogenous in appearance but no history of PMB, consistent with previous endometrial ablation, no endometrial sampling is indicated in this clinical setting  Right ovary with small simple cyst, thin septation, no other associated abnormalities Left ovary is normal   [name] 02/15/2020 7:00 AM            Assessment: Second degree uterine descensus, cystocele, rectocele..   Plan: Patient will undergo surgical management with vaginal hysterectomy  Bilateral salpingoophorectomy with anterior and posterior repair.

## 2020-03-06 DIAGNOSIS — Z87442 Personal history of urinary calculi: Secondary | ICD-10-CM | POA: Diagnosis not present

## 2020-03-06 DIAGNOSIS — N812 Incomplete uterovaginal prolapse: Secondary | ICD-10-CM | POA: Diagnosis not present

## 2020-03-06 DIAGNOSIS — N8111 Cystocele, midline: Secondary | ICD-10-CM | POA: Diagnosis not present

## 2020-03-06 DIAGNOSIS — M1990 Unspecified osteoarthritis, unspecified site: Secondary | ICD-10-CM | POA: Diagnosis not present

## 2020-03-06 DIAGNOSIS — Z880 Allergy status to penicillin: Secondary | ICD-10-CM | POA: Diagnosis not present

## 2020-03-06 DIAGNOSIS — Z91040 Latex allergy status: Secondary | ICD-10-CM | POA: Diagnosis not present

## 2020-03-06 DIAGNOSIS — Z885 Allergy status to narcotic agent status: Secondary | ICD-10-CM | POA: Diagnosis not present

## 2020-03-06 DIAGNOSIS — D259 Leiomyoma of uterus, unspecified: Secondary | ICD-10-CM | POA: Diagnosis not present

## 2020-03-06 DIAGNOSIS — Z888 Allergy status to other drugs, medicaments and biological substances status: Secondary | ICD-10-CM | POA: Diagnosis not present

## 2020-03-06 DIAGNOSIS — Z882 Allergy status to sulfonamides status: Secondary | ICD-10-CM | POA: Diagnosis not present

## 2020-03-06 DIAGNOSIS — I10 Essential (primary) hypertension: Secondary | ICD-10-CM | POA: Diagnosis not present

## 2020-03-06 DIAGNOSIS — Z9049 Acquired absence of other specified parts of digestive tract: Secondary | ICD-10-CM | POA: Diagnosis not present

## 2020-03-06 DIAGNOSIS — Z79899 Other long term (current) drug therapy: Secondary | ICD-10-CM | POA: Diagnosis not present

## 2020-03-06 DIAGNOSIS — Z881 Allergy status to other antibiotic agents status: Secondary | ICD-10-CM | POA: Diagnosis not present

## 2020-03-06 LAB — BASIC METABOLIC PANEL
Anion gap: 9 (ref 5–15)
BUN: 12 mg/dL (ref 6–20)
CO2: 23 mmol/L (ref 22–32)
Calcium: 8.8 mg/dL — ABNORMAL LOW (ref 8.9–10.3)
Chloride: 107 mmol/L (ref 98–111)
Creatinine, Ser: 0.69 mg/dL (ref 0.44–1.00)
GFR calc Af Amer: >60 mL/min (ref >60)
GFR calc non Af Amer: >60 mL/min (ref >60)
Glucose, Bld: 119 mg/dL — ABNORMAL HIGH (ref 70–99)
Potassium: 4.1 mmol/L (ref 3.5–5.1)
Sodium: 139 mmol/L (ref 135–145)

## 2020-03-06 LAB — CBC
HCT: 44.8 % (ref 36.0–46.0)
Hemoglobin: 14.3 g/dL (ref 12.0–15.0)
MCH: 29.7 pg (ref 26.0–34.0)
MCHC: 31.9 g/dL (ref 30.0–36.0)
MCV: 92.9 fL (ref 80.0–100.0)
Platelets: 174 10*3/uL (ref 150–400)
RBC: 4.82 MIL/uL (ref 3.87–5.11)
RDW: 13 % (ref 11.5–15.5)
WBC: 16 10*3/uL — ABNORMAL HIGH (ref 4.0–10.5)
nRBC: 0 % (ref 0.0–0.2)

## 2020-03-06 MED ORDER — KETOROLAC TROMETHAMINE 10 MG PO TABS: 10.0000 mg | ORAL_TABLET | Freq: Four times a day (QID) | ORAL | 0 refills | Status: DC

## 2020-03-06 MED ORDER — TRAMADOL HCL 50 MG PO TABS: 50.0000 mg | ORAL_TABLET | Freq: Four times a day (QID) | ORAL | 0 refills | Status: DC | PRN

## 2020-03-06 NOTE — Progress Notes (Signed)
1 Day Post-Op Procedure(s) (LRB): HYSTERECTOMY VAGINAL (Bilateral) ANTERIOR (CYSTOCELE) AND POSTERIOR REPAIR (RECTOCELE) (N/A)  Subjective: Patient reports nausea, tolerating PO and + flatus.  Vag packing out. Catheter removed by me this a.m.  Objective: I have reviewed patient's vital signs, intake and output, medications and labs. CBC Latest Ref Rng & Units 03/06/2020 02/28/2020 01/24/2016  WBC 4.0 - 10.5 K/uL 16.0(H) 7.2 9.6  Hemoglobin 12.0 - 15.0 g/dL 14.3 14.5 15.0  Hematocrit 36.0 - 46.0 % 44.8 46.3(H) 45.4  Platelets 150 - 400 K/uL 174 174 178    General: alert, cooperative and no distress Resp: clear to auscultation bilaterally GI: soft, non-tender; bowel sounds normal; no masses,  no organomegaly Extremities: Homans sign is negative, no sign of DVT Vaginal Bleeding: minimal  Assessment: s/p Procedure(s): HYSTERECTOMY VAGINAL (Bilateral) ANTERIOR (CYSTOCELE) AND POSTERIOR REPAIR (RECTOCELE) (N/A): stable  Plan: Discontinue IV fluids Discharge home  LOS: 1 day    Emily Hawkins 03/06/2020, 8:14 AM

## 2020-03-06 NOTE — Discharge Summary (Signed)
Physician Discharge Summary  Patient ID: ANTARA GHUMAN MRN: [id number] DOB/AGE: [birth year] 58 y.o.  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 Mantz was admitted through day surgery, after bowel prep with hemoglobin 14.6.  She underwent vaginal hysterectomy anterior and posterior repair, was kept overnight for pain management, but did not use the PCA pump at all because she had a bit of mild nausea and was afraid that the pain medicine would worsen her pain.  She was alert well-hydrated tolerating p.o. liquids the following morning, with stable hemoglobin.  Foley catheter was discontinued and vaginal packing removed.  She will be discharged home on stool softener Toradol and tramadol for pain for follow-up April 1 to my office.  Consults: None  Significant Diagnostic Studies: labs:  CBC Latest Ref Rng & Units 03/06/2020 02/28/2020 01/24/2016  WBC 4.0 - 10.5 K/uL 16.0(H) 7.2 9.6  Hemoglobin 12.0 - 15.0 g/dL 14.3 14.5 15.0  Hematocrit 36.0 - 46.0 % 44.8 46.3(H) 45.4  Platelets 150 - 400 K/uL 174 174 178     Treatments: surgery: Vaginal hysterectomy anterior and posterior repair  Discharge Exam: Blood pressure 125/69, pulse 75, temperature (!) 97.5 F (36.4 C), temperature source Oral, resp. rate 19, height 5\' 4"  (1.626 m), weight 71.5 kg, SpO2 100 %. General appearance: alert, cooperative and no distress Resp: clear to auscultation bilaterally GI: soft, non-tender; bowel sounds normal; no masses,  no organomegaly Pelvic: external genitalia normal Extremities: extremities normal, atraumatic, no cyanosis or edema and Homans sign is negative, no sign of DVT  Disposition:  Home   Follow-up Information    [name], MD Follow up in 1 week(s).   Specialties: Obstetrics and Gynecology, Radiology Why: dr Glo Herring may be reached on his cell at [phone] Contact information: [place] [address] [phone]           Signed: [name] 03/06/2020, 8:20 AM

## 2020-03-06 NOTE — Progress Notes (Signed)
Vaginal packing removed intact per orders. Pt tolerated well.

## 2020-03-07 LAB — SURGICAL PATHOLOGY

## 2020-03-08 NOTE — Progress Notes (Signed)
All uterine tissues benign on hysterectomy specimen

## 2020-03-14 ENCOUNTER — Ambulatory Visit (INDEPENDENT_AMBULATORY_CARE_PROVIDER_SITE_OTHER): Payer: BC Managed Care – PPO | Admitting: Obstetrics and Gynecology

## 2020-03-14 ENCOUNTER — Other Ambulatory Visit: Payer: Self-pay

## 2020-03-14 VITALS — BP 133/79 | HR 78 | Ht 61.0 in | Wt 145.0 lb

## 2020-03-14 DIAGNOSIS — Z90710 Acquired absence of both cervix and uterus: Secondary | ICD-10-CM

## 2020-03-14 DIAGNOSIS — Z09 Encounter for follow-up examination after completed treatment for conditions other than malignant neoplasm: Secondary | ICD-10-CM

## 2020-03-14 DIAGNOSIS — N812 Incomplete uterovaginal prolapse: Secondary | ICD-10-CM

## 2020-03-14 NOTE — Progress Notes (Signed)
Patient ID: Emily Hawkins, female   DOB: [birth year], 59 y.o.   MRN: [id number]     Subjective:  Emily Hawkins is a 59 y.o. female now 1 weeks status post vaginal hysterectomy.  With anterior and posterior repair. She feels well, but she is concerned about the abrasion on her   She also notes some herpes breakouts along the abrasion, which is irritating. She is experiencing more increased urgency than before; she has not had any issues with incontinence.   Review of Systems Negative except herpes flair up, vaginal abrasion, increased urgency. No loss of urine.    Diet:   normal   Bowel movements : normal.  Pain is controlled without any medications.  Objective:  There were no vitals taken for this visit. General:Well developed, well nourished.  No acute distress. Abdomen: Bowel sounds normal, soft, non-tender. Pelvic Exam:    External Genitalia:  Normal.except for 1 cm abrasion right l. Majora 8 oclock, no lesions suspected to be herpes.    Vagina: 1cm superficial ablation right labia majora; no leisions    Cervix: absent,cuff well supported, no erythema    Uterus: surgical absent    Adnexa/Bimanual: Normal   Incision(s): Healing well, no drainage, no erythema, no hernia, no swelling, no dehiscence     Assessment:  Post-Op 1 weeks s/p vaginal hysterectomy   Doing well postoperatively.   Plan:  1.Wound care discussed: apply neosporin to abrasion 2. Current medications:. 3. Activity restrictions: no lifting more than 15 pounds, no standing longer than 2 without a break and no longer than 8 hour workdays for the first two weeks back.  4. return to work: 2-3 weeks. 5. Follow up in 2 weeks.   By signing my name below, I, [employer], attest that this documentation has been prepared under the direction and in the presence of [name], MD. Electronically Signed: [place]. 03/14/20. 8:32 AM.  I personally performed the services described in this  documentation, which was SCRIBED in my presence. The recorded information has been reviewed and considered accurate. It has been edited as necessary during review. [name], MD

## 2020-03-25 DIAGNOSIS — M9901 Segmental and somatic dysfunction of cervical region: Secondary | ICD-10-CM | POA: Diagnosis not present

## 2020-03-25 DIAGNOSIS — M546 Pain in thoracic spine: Secondary | ICD-10-CM | POA: Diagnosis not present

## 2020-03-25 DIAGNOSIS — M9902 Segmental and somatic dysfunction of thoracic region: Secondary | ICD-10-CM | POA: Diagnosis not present

## 2020-03-25 DIAGNOSIS — M542 Cervicalgia: Secondary | ICD-10-CM | POA: Diagnosis not present

## 2020-04-03 ENCOUNTER — Ambulatory Visit: Payer: BC Managed Care – PPO | Admitting: Obstetrics and Gynecology

## 2020-04-03 ENCOUNTER — Encounter: Payer: Self-pay | Admitting: Obstetrics and Gynecology

## 2020-04-03 ENCOUNTER — Telehealth: Payer: Self-pay | Admitting: Obstetrics & Gynecology

## 2020-04-03 ENCOUNTER — Other Ambulatory Visit: Payer: Self-pay

## 2020-04-03 ENCOUNTER — Telehealth: Payer: Self-pay | Admitting: *Deleted

## 2020-04-03 VITALS — BP 139/83 | HR 71 | Ht 61.0 in | Wt 144.8 lb

## 2020-04-03 DIAGNOSIS — Z09 Encounter for follow-up examination after completed treatment for conditions other than malignant neoplasm: Secondary | ICD-10-CM

## 2020-04-03 DIAGNOSIS — N812 Incomplete uterovaginal prolapse: Secondary | ICD-10-CM

## 2020-04-03 DIAGNOSIS — Z90710 Acquired absence of both cervix and uterus: Secondary | ICD-10-CM

## 2020-04-03 NOTE — Progress Notes (Addendum)
Patient ID: Emily Hawkins, female   DOB: [birth year], 59 y.o.   MRN: [id number]    Subjective:  Emily Hawkins is a 59 y.o. female now 3 weeks status post vaginal hysterectomy.  She desires to return to work.  She is very pleased with her recovery to date.  She notes some recent neck pain for which she is seeing a chiropractor for.,  Described as bone-on-bone.  There is no peripheral pains described  Friday and Saturday she notes that she was having a really sharp pain around her left inguinal area over the weekend that has resolved.  She was quite active over the weekend working on property  Review of Systems Negative except minor pain    Diet:   normal   Bowel movements : normal.  Pain is controlled without any medications.  Objective:  Ht 5\' 1"  (1.549 m)   Wt 144 lb 12.8 oz (65.7 kg)   LMP 10/10/2011   BMI 27.36 kg/m    General:Well developed, well nourished.  No acute distress. Abdomen: Bowel sounds normal, soft, non-tender. Pelvic Exam:    External Genitalia:  Mild irritation from moisture.     Vagina: Normal slightly shortened vaginal length, 10 cm    cervix:l surgically absent.  Well supported vaginal cuff    Uterus: Surgically absent.  Nontender palpation of vaginal cuff.  Good support on left greater higher than the right    Adnexa/Bimanual: Normal  Incision(s): Healing well, no drainage, no erythema, no hernia, no swelling, no dehiscence moderate vaginal discharge   Assessment:  Post-Op 3 weeks s/p vaginal hysterectomy   Good bladder support postoperatively.  Good posterior support   Plan:  1. Activity restrictions: no lifting more than 10 pounds and no sexual activity until 8 weeks s/p hysterectomy 2. return to work: with restrictions: no lifting over 10 lbs and no more than 8 hrs per day for the first two weeks.,  Effective next Monday.  Given return to work note 3. Follow up PRN.   By signing my name below, I, [employer], attest that this documentation has  been prepared under the direction and in the presence of [name], MD. Electronically Signed: [place]. 04/03/20. 10:50 AM.  I personally performed the services described in this documentation, which was SCRIBED in my presence. The recorded information has been reviewed and considered accurate. It has been edited as necessary during review. [name], MD

## 2020-04-03 NOTE — Telephone Encounter (Signed)
Patient would like you to call her regarding her FMLA papers.  [phone]

## 2020-04-03 NOTE — Telephone Encounter (Signed)
Patient wanted to let me know that another form was faxed from [place] regarding lifting limitations and 8hr/day restrictions. Form has been received and will be faxed today.

## 2020-05-03 DIAGNOSIS — Z23 Encounter for immunization: Secondary | ICD-10-CM | POA: Diagnosis not present

## 2020-05-16 DIAGNOSIS — E663 Overweight: Secondary | ICD-10-CM | POA: Diagnosis not present

## 2020-05-16 DIAGNOSIS — N764 Abscess of vulva: Secondary | ICD-10-CM | POA: Diagnosis not present

## 2020-05-16 DIAGNOSIS — Z6826 Body mass index (BMI) 26.0-26.9, adult: Secondary | ICD-10-CM | POA: Diagnosis not present

## 2020-06-10 DIAGNOSIS — G43719 Chronic migraine without aura, intractable, without status migrainosus: Secondary | ICD-10-CM | POA: Diagnosis not present

## 2020-06-10 IMAGING — MG DIGITAL SCREENING BILATERAL MAMMOGRAM WITH TOMO AND CAD
6 of 10 series · 6 of 30 positions shown · non-contrast
Comparison: Previous exam(s).

CLINICAL DATA: Screening.

EXAM:
DIGITAL SCREENING BILATERAL MAMMOGRAM WITH TOMO AND CAD

[L CC synth-2D]
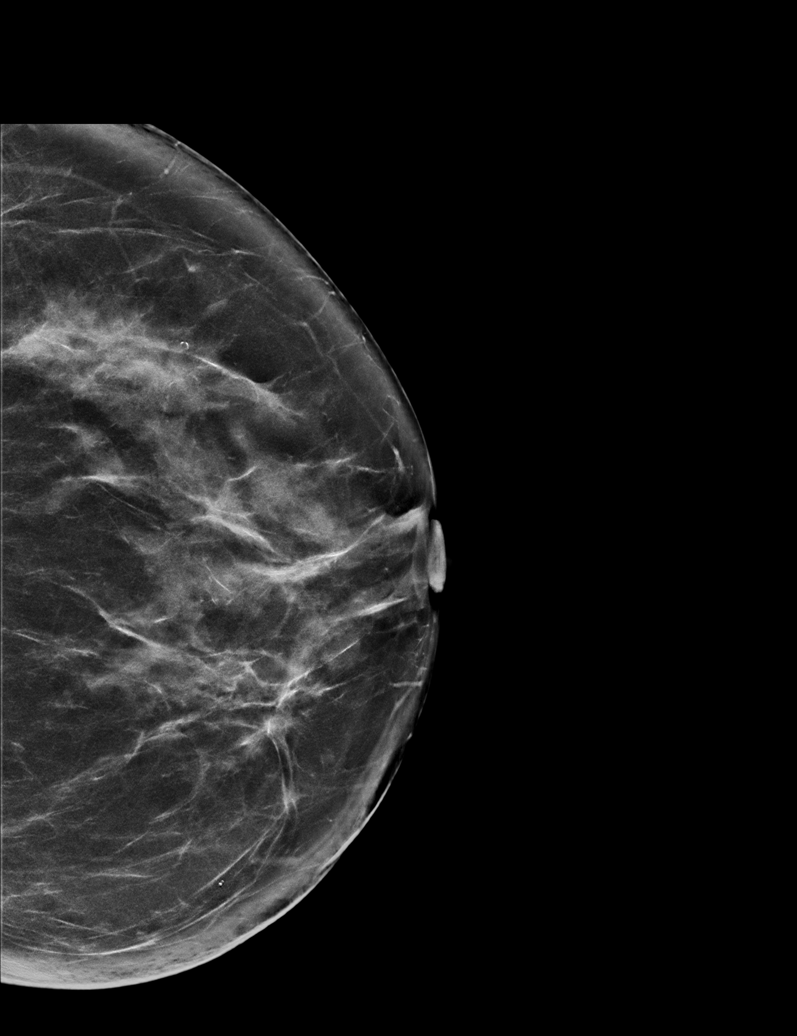

[R MLO synth-2D (1 of 2)]
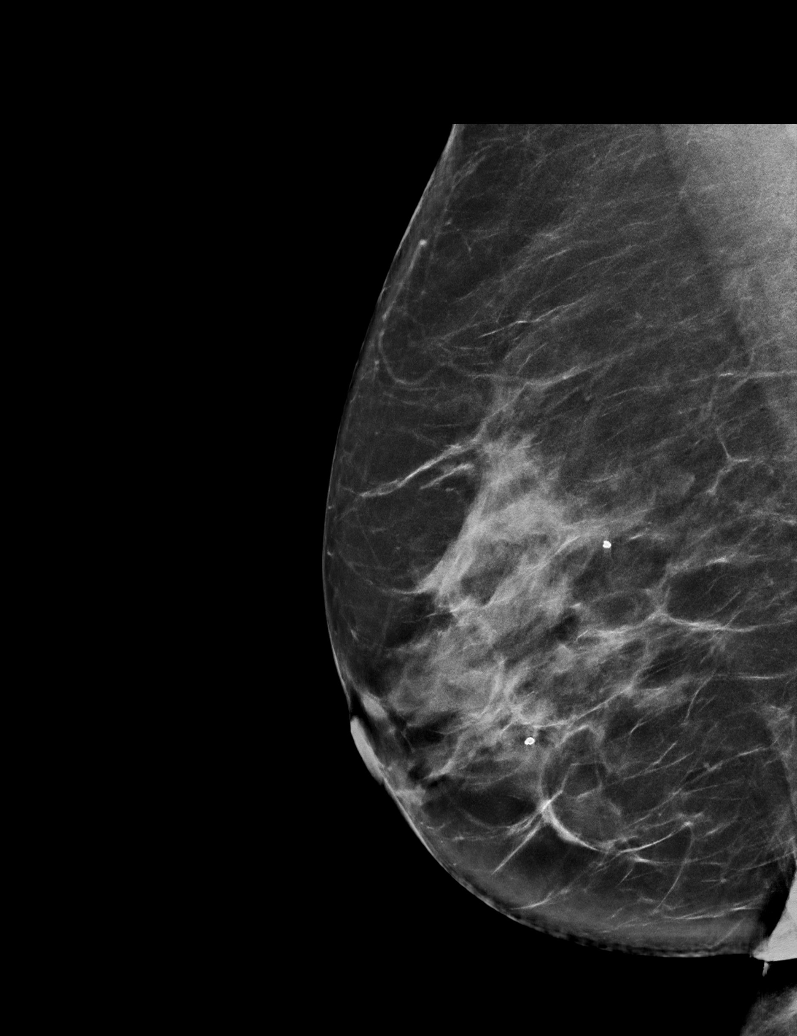

[L MLO synth-2D]
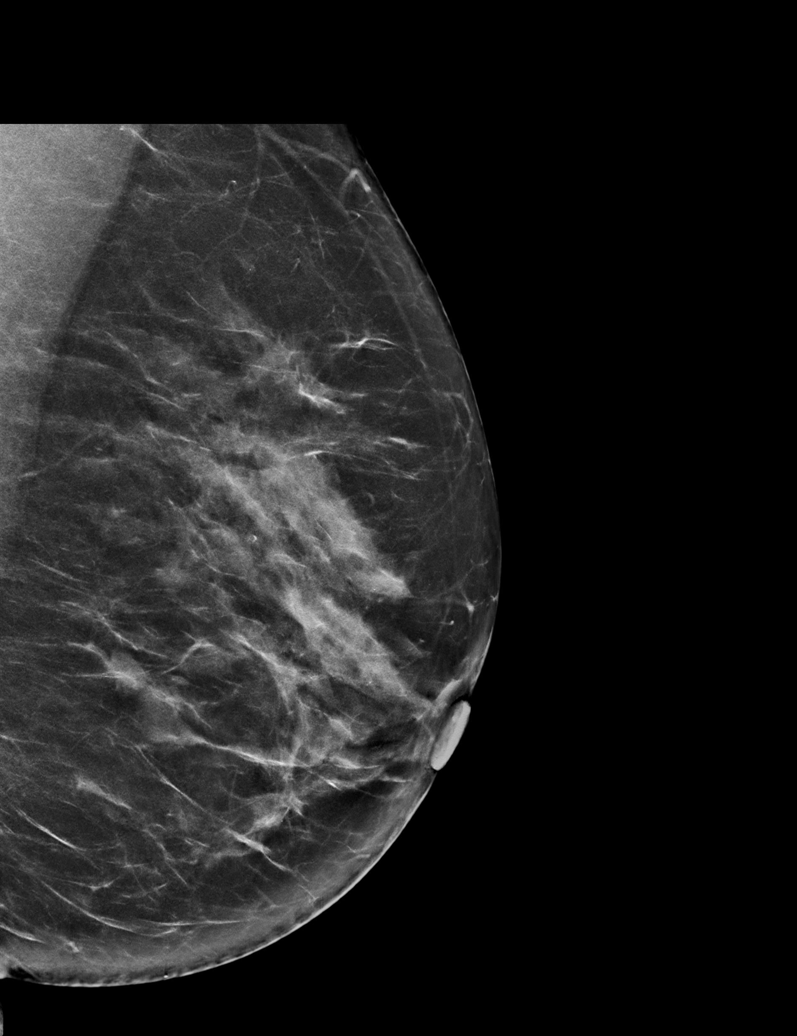

[R CC synth-2D]
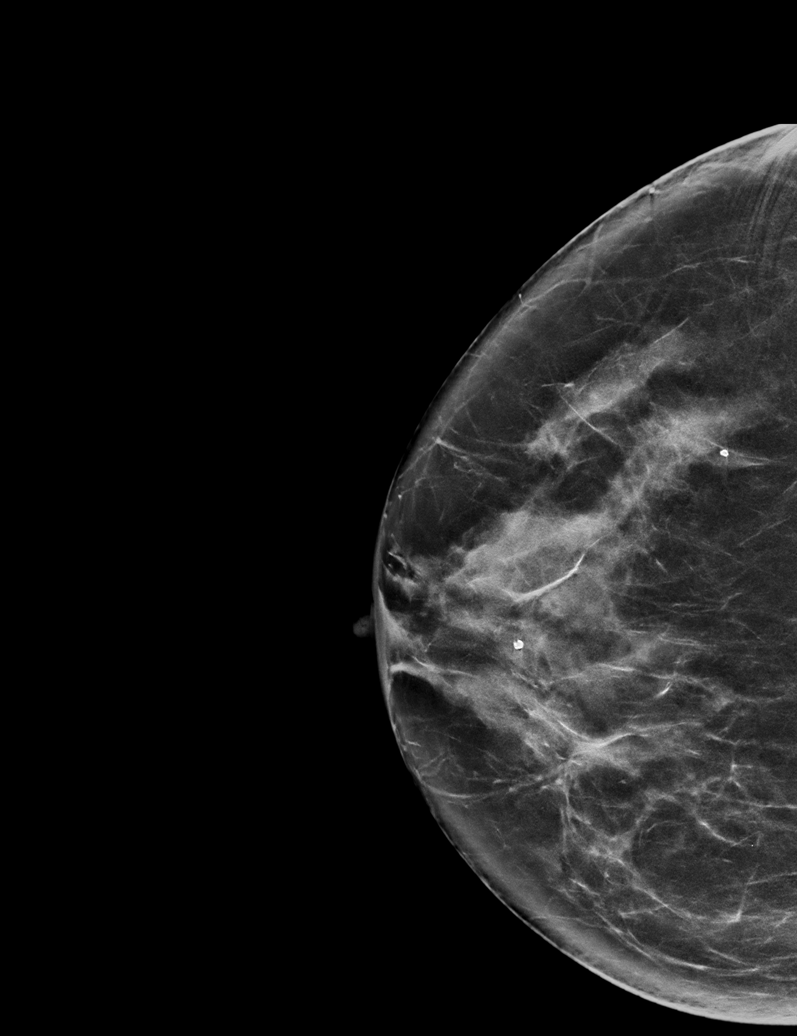

[R MLO synth-2D (2 of 2)]
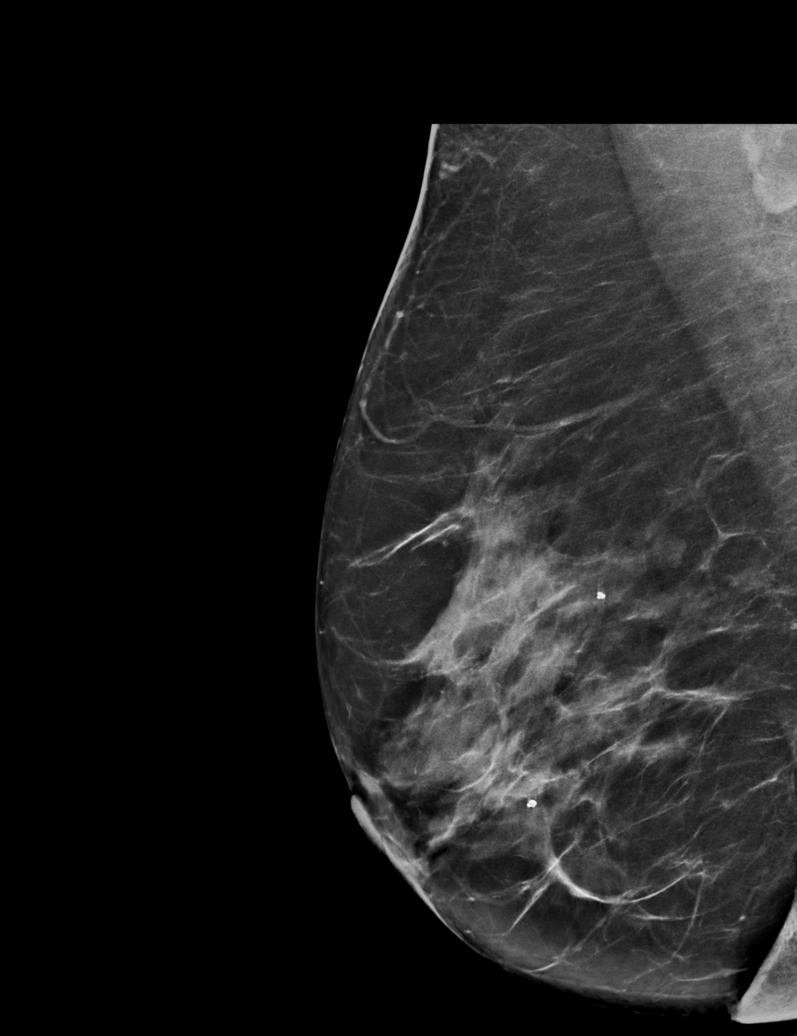

[L CC tomo · tomo slice 37/74.0]
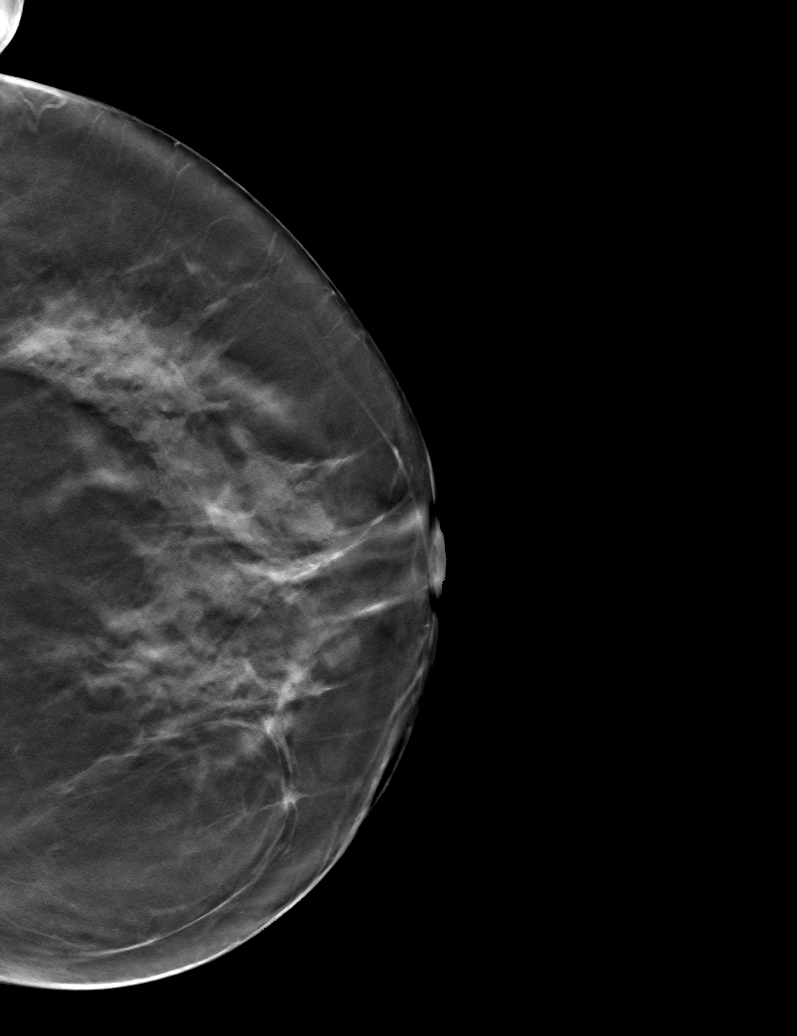

[6 of 30 positions shown; findings below may reference images not displayed]

ACR Breast Density Category c: The breast tissue is heterogeneously
dense, which may obscure small masses.
FINDINGS: There are no findings suspicious for malignancy. Images were
processed with CAD.
IMPRESSION: No mammographic evidence of malignancy. A result letter of this
screening mammogram will be mailed directly to the patient.

RECOMMENDATION:
Screening mammogram in one year. (Code:FT-U-LHB)

BI-RADS CATEGORY  1: Negative.

## 2020-06-13 DIAGNOSIS — I10 Essential (primary) hypertension: Secondary | ICD-10-CM | POA: Diagnosis not present

## 2020-06-13 DIAGNOSIS — E663 Overweight: Secondary | ICD-10-CM | POA: Diagnosis not present

## 2020-06-13 DIAGNOSIS — F330 Major depressive disorder, recurrent, mild: Secondary | ICD-10-CM | POA: Diagnosis not present

## 2020-06-13 DIAGNOSIS — Z6827 Body mass index (BMI) 27.0-27.9, adult: Secondary | ICD-10-CM | POA: Diagnosis not present

## 2020-06-13 DIAGNOSIS — F329 Major depressive disorder, single episode, unspecified: Secondary | ICD-10-CM | POA: Diagnosis not present

## 2020-06-13 DIAGNOSIS — G4709 Other insomnia: Secondary | ICD-10-CM | POA: Diagnosis not present

## 2020-06-18 DIAGNOSIS — E7849 Other hyperlipidemia: Secondary | ICD-10-CM | POA: Diagnosis not present

## 2020-09-04 ENCOUNTER — Ambulatory Visit: Payer: BC Managed Care – PPO | Admitting: Obstetrics and Gynecology

## 2020-11-19 DIAGNOSIS — G43719 Chronic migraine without aura, intractable, without status migrainosus: Secondary | ICD-10-CM | POA: Diagnosis not present

## 2020-12-10 DIAGNOSIS — M546 Pain in thoracic spine: Secondary | ICD-10-CM | POA: Diagnosis not present

## 2020-12-10 DIAGNOSIS — M9902 Segmental and somatic dysfunction of thoracic region: Secondary | ICD-10-CM | POA: Diagnosis not present

## 2020-12-10 DIAGNOSIS — M9901 Segmental and somatic dysfunction of cervical region: Secondary | ICD-10-CM | POA: Diagnosis not present

## 2020-12-10 DIAGNOSIS — M542 Cervicalgia: Secondary | ICD-10-CM | POA: Diagnosis not present

## 2021-01-02 DIAGNOSIS — Z23 Encounter for immunization: Secondary | ICD-10-CM | POA: Diagnosis not present

## 2021-01-21 ENCOUNTER — Encounter: Payer: Self-pay | Admitting: Adult Health

## 2021-01-21 ENCOUNTER — Other Ambulatory Visit: Payer: Self-pay

## 2021-01-21 ENCOUNTER — Ambulatory Visit: Payer: BC Managed Care – PPO | Admitting: Adult Health

## 2021-01-21 VITALS — BP 135/75 | HR 68 | Ht 61.0 in | Wt 147.5 lb

## 2021-01-21 DIAGNOSIS — N8111 Cystocele, midline: Secondary | ICD-10-CM | POA: Diagnosis not present

## 2021-01-21 DIAGNOSIS — N362 Urethral caruncle: Secondary | ICD-10-CM | POA: Diagnosis not present

## 2021-01-21 DIAGNOSIS — N393 Stress incontinence (female) (male): Secondary | ICD-10-CM

## 2021-01-21 DIAGNOSIS — Z90710 Acquired absence of both cervix and uterus: Secondary | ICD-10-CM

## 2021-01-21 NOTE — Progress Notes (Signed)
  Subjective:     Patient ID: Emily Hawkins, female   DOB: Apr 25, 1961, 60 y.o.   MRN: [id number]  HPI [name] is a 60 year old white female,divorced, sp [facility] A&P repair in March 2021 and now feels and looks different down there now and had some SUI when coughing last week.  PCP is [place].   Review of Systems Had stress urinary incontinence  last week Looks different and feels different down there, ?bladder drop Not sexually active Denies any problems with BM  Reviewed past medical,surgical, social and family history. Reviewed medications and allergies.     Objective:   Physical Exam BP 135/75 (BP Location: Left Arm, Patient Position: Sitting, Cuff Size: Normal)   Pulse 68   Ht 5\' 1"  (1.549 m)   Wt 147 lb 8 oz (66.9 kg)   LMP 10/10/2011   BMI 27.87 kg/m   Skin warm and dry. Neck: mid line trachea, normal thyroid, good ROM, no lymphadenopathy noted. Lungs: clear to ausculation bilaterally. Cardiovascular: regular rate and rhythm. Pelvic: external genitalia is normal in appearance no lesions, vagina: pale pink with loss of moisture and rugae, has mild cystocele,urethra has small caruncle present, non friable, cervix and uterus are absent. adnexa: no masses or tenderness noted. Bladder is non tender and no masses felt.On rectal exam has good tone and no rectocele   Fall risk is low     [facility] - 01/21/21 1551      Pregnancy Intention Screening   Does the patient want to become pregnant in the next year? N/A    Does the patient's partner want to become pregnant in the next year? N/A    Would the patient like to discuss contraceptive options today? N/A      Contraception Wrap Up   Current Method Female Sterilization   hyst   End Method Female Sterilization   hyst   Contraception Counseling Provided No         Examination chaperoned by Tinnie Gens NP student, who assisted with exam.   Assessment:     1. S/P VH (vaginal hysterectomy)   2. Urethral caruncle Just watch   3.  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, midline It is mild but if causes any pressure could try pessary  4. Urinary, incontinence, stress female Cal if becomes persistent     Plan:     Follow up prn

## 2021-05-12 ENCOUNTER — Ambulatory Visit (INDEPENDENT_AMBULATORY_CARE_PROVIDER_SITE_OTHER): Payer: 59

## 2021-05-12 ENCOUNTER — Encounter: Payer: Self-pay | Admitting: Emergency Medicine

## 2021-05-12 ENCOUNTER — Other Ambulatory Visit: Payer: Self-pay

## 2021-05-12 ENCOUNTER — Ambulatory Visit
Admission: EM | Admit: 2021-05-12 | Discharge: 2021-05-12 | Disposition: A | Discharge status: Home / Self Care | Payer: 59 | Attending: Family Medicine | Admitting: Family Medicine

## 2021-05-12 DIAGNOSIS — R079 Chest pain, unspecified: Secondary | ICD-10-CM | POA: Diagnosis not present

## 2021-05-12 DIAGNOSIS — R0789 Other chest pain: Secondary | ICD-10-CM

## 2021-05-12 DIAGNOSIS — L309 Dermatitis, unspecified: Secondary | ICD-10-CM

## 2021-05-12 DIAGNOSIS — M940 Chondrocostal junction syndrome [Tietze]: Secondary | ICD-10-CM

## 2021-05-12 MED ORDER — PREDNISONE 20 MG PO TABS: ORAL_TABLET | ORAL | 0 refills | Status: DC

## 2021-05-12 MED ORDER — TRIAMCINOLONE ACETONIDE 0.1 % EX CREA: 1.0000 "application " | TOPICAL_CREAM | Freq: Two times a day (BID) | CUTANEOUS | 0 refills | Status: DC

## 2021-05-12 NOTE — ED Provider Notes (Signed)
[facility]    CSN: [id number] Arrival date & time: 05/12/21  1609      History   Chief Complaint No chief complaint on file.   HPI Emily Hawkins is a 60 y.o. female.   HPI Patient presents with localized pain lateral left chest wall radiating under the left breast.Pain hs been present x 2 weeks intermittently. Pain overnight worsened. Pain is present with deep breathing, reaching, and coughing. No recent falls nor has she lifted any unusual heavy object. No prior history of chest wall pain. She has taken otc medication occasionally without relief. Patient also has a diffuse rash on her right and left abdomen which is erythematous and raised. Patient report rash is itching no burning. Rash is also present on her neck. Rash has been present for about one week. Past Medical History:  Diagnosis Date  . Arthritis   . History of kidney stones   . Hypertension   . Liver laceration   . Migraine   . PONV (postoperative nausea and vomiting)   . Vertigo     Patient Active Problem List   Diagnosis Date Noted  . 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 Urethral caruncle 01/21/2021  . Urinary, incontinence, stress female 01/21/2021  . Postop check 03/14/2020  . 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 03/05/2020  . S/P VH (vaginal hysterectomy) 03/05/2020    Past Surgical History:  Procedure Laterality Date  . ABDOMINAL SURGERY     lacerated liver repaired  . ANTERIOR AND POSTERIOR REPAIR N/A 03/05/2020   Procedure: ANTERIOR (CYSTOCELE) AND POSTERIOR REPAIR (RECTOCELE);  Surgeon: [name], MD;  Location: [facility];  Service: Gynecology;  Laterality: N/A;  . CARDIAC CATHETERIZATION    . CHOLECYSTECTOMY  10 yrs ago  . EXTRACORPOREAL SHOCK WAVE LITHOTRIPSY Left 04/12/2013   Procedure: EXTRACORPOREAL SHOCK WAVE LITHOTRIPSY (ESWL) LEFT RENAL CALCULUS;  Surgeon: Marissa Nestle, MD;  Location: [facility];  Service: Urology;  Laterality:  Left;  . LASER ABLATION     uterus  . NOSE SURGERY    . TUBAL LIGATION    . VAGINAL HYSTERECTOMY Bilateral 03/05/2020   Procedure: HYSTERECTOMY VAGINAL;  Surgeon: [name], MD;  Location: [facility];  Service: Gynecology;  Laterality: Bilateral;    OB History    Gravida  2   Para  2   Term  2   Preterm      AB      Living  2     SAB      IAB      Ectopic      Multiple      Live Births  2            Home Medications    Prior to Admission medications   Medication Sig Start Date End Date Taking? Authorizing Provider  ALPRAZolam Duanne Moron) 1 MG tablet Take 1 mg by mouth daily as needed (anxiety).  01/08/20   [provider]  atorvastatin (LIPITOR) 20 MG tablet Take 20 mg by mouth at bedtime.     [provider]  lisinopril (PRINIVIL,ZESTRIL) 10 MG tablet Take 10 mg by mouth at bedtime.     [provider]  valACYclovir (VALTREX) 500 MG tablet Take 500 mg by mouth daily as needed (outbreaks).  01/22/20   [provider]  zonisamide (ZONEGRAN) 100 MG capsule Take 100 mg by mouth daily. 08/18/20   [provider]    Family History Family History  Problem Relation Age of Onset  . Hypertension Sister   . Asthma Sister     Social History Social History   Tobacco Use  . Smoking status: Never Smoker  . Smokeless tobacco: Never Used  Vaping Use  . Vaping Use: Never used  Substance Use Topics  . Alcohol use: No  . Drug use: No     Allergies   Flagyl [metronidazole hcl], Latex, Oxycodone, Penicillins, and Sulfa antibiotics   Review of Systems Review of Systems Pertinent negatives listed in HPI  Physical Exam Triage Vital Signs ED Triage Vitals  Enc Vitals Group     BP 05/12/21 1623 (!) 149/91     Pulse Rate 05/12/21 1623 74     Resp 05/12/21 1623 16     Temp 05/12/21 1623 98 F (36.7 C)     Temp Source 05/12/21 1623 Oral     SpO2 05/12/21 1623 98 %     Weight --      Height --      Head Circumference  --      Peak Flow --      Pain Score 05/12/21 1626 8     Pain Loc --      Pain Edu? --      Excl. in [place]? --    No data found.  Updated Vital Signs BP (!) 149/91 (BP Location: Right Arm)   Pulse 74   Temp 98 F (36.7 C) (Oral)   Resp 16   LMP 10/10/2011   SpO2 98%   Visual Acuity Right Eye Distance:   Left Eye Distance:   Bilateral Distance:    Right Eye Near:   Left Eye Near:    Bilateral Near:     Physical Exam Constitutional:      Appearance: Normal appearance. She is not ill-appearing.  Cardiovascular:     Rate and Rhythm: Normal rate and regular rhythm.  Pulmonary:     Effort: Pulmonary effort is normal.     Breath sounds: Normal breath sounds and air entry. No decreased breath sounds or wheezing.  Chest:    Neurological:     General: No focal deficit present.     GCS: GCS eye subscore is 4. GCS verbal subscore is 5. GCS motor subscore is 6.  Psychiatric:        Attention and Perception: Attention normal.        Mood and Affect: Mood normal.        Speech: Speech normal.        Behavior: Behavior is cooperative.        Cognition and Memory: Cognition normal.      UC Treatments / Results  Labs (all labs ordered are listed, but only abnormal results are displayed) Labs Reviewed - No data to display  EKG   Radiology DG Chest 2 View  Result Date: 05/12/2021 CLINICAL DATA:  Left-sided chest pain for 2 days EXAM: CHEST - 2 VIEW COMPARISON:  01/24/2016 FINDINGS: The heart size and mediastinal contours are within normal limits. Both lungs are clear. The visualized skeletal structures are unremarkable. 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 IMPRESSION: No acute abnormality noted. Electronically Signed   By: Inez Catalina M.D.   On: 05/12/2021 17:56   Procedures Procedures (including critical care time)  Medications Ordered in UC Medications - No data to display  Initial Impression / Assessment and Plan / UC Course  I have reviewed the  triage vital signs and the nursing  notes.  Pertinent labs & imaging results that were available during my care of the patient were reviewed by me and considered in my medical decision making (see chart for details).     Left sided chest wall pain likely related to costochondritis. Trial prednisone. Concern for possible shingles associated pain, however distribution of rash is inconsistent with shingles presentation as the rash is non-painful and sporadic distribution more consistent with dermatitis, Treatment per discharge medications. ER precautions given if symptoms worsen or do not improve.  Final Clinical Impressions(s) / UC Diagnoses   Final diagnoses:  Left-sided chest wall pain  Costochondritis, acute  Dermatitis   Discharge Instructions   None    ED Prescriptions    Medication Sig Dispense Auth. Provider   predniSONE (DELTASONE) 20 MG tablet Take 2 PO QAM  X 3 days then  1 PO QAM x 3 days 8 tablet Scot Jun, FNP   triamcinolone cream (KENALOG) 0.1 % Apply 1 application topically 2 (two) times daily. 454 g Scot Jun, FNP     PDMP not reviewed this encounter.   Scot Jun, FNP 05/14/21 2229

## 2021-05-12 NOTE — ED Triage Notes (Signed)
Chest pain under left breast area that started yesteday.  States area is sensitive and hurts to touch the area. States severe pain comes and goes.  Hurts to take a deep breath and to cough.

## 2021-05-13 ENCOUNTER — Telehealth: Payer: Self-pay | Admitting: Emergency Medicine

## 2021-05-13 DIAGNOSIS — M940 Chondrocostal junction syndrome [Tietze]: Secondary | ICD-10-CM

## 2021-05-13 MED ORDER — PREDNISONE 20 MG PO TABS: ORAL_TABLET | ORAL | 0 refills | Status: DC

## 2021-05-13 NOTE — Telephone Encounter (Signed)
Re sent prednisone. Qty #9

## 2021-05-26 ENCOUNTER — Other Ambulatory Visit: Payer: Self-pay

## 2021-05-26 ENCOUNTER — Encounter: Payer: Self-pay | Admitting: Emergency Medicine

## 2021-05-26 ENCOUNTER — Ambulatory Visit
Admission: EM | Admit: 2021-05-26 | Discharge: 2021-05-26 | Disposition: A | Discharge status: Home / Self Care | Payer: 59 | Attending: Family Medicine | Admitting: Family Medicine

## 2021-05-26 DIAGNOSIS — L03119 Cellulitis of unspecified part of limb: Secondary | ICD-10-CM

## 2021-05-26 DIAGNOSIS — L02419 Cutaneous abscess of limb, unspecified: Secondary | ICD-10-CM | POA: Diagnosis not present

## 2021-05-26 MED ORDER — DOXYCYCLINE HYCLATE 100 MG PO CAPS: 100.0000 mg | ORAL_CAPSULE | Freq: Two times a day (BID) | ORAL | 0 refills | Status: DC

## 2021-05-26 NOTE — ED Triage Notes (Signed)
Boil under right armpit since last Tuesday.

## 2021-09-04 ENCOUNTER — Other Ambulatory Visit (HOSPITAL_COMMUNITY): Payer: Self-pay | Admitting: Physician Assistant

## 2021-09-04 ENCOUNTER — Other Ambulatory Visit: Payer: Self-pay

## 2021-09-04 ENCOUNTER — Ambulatory Visit (HOSPITAL_COMMUNITY)
Admission: RE | Admit: 2021-09-04 | Discharge: 2021-09-04 | Disposition: A | Discharge status: Home / Self Care | Payer: 59 | Source: Ambulatory Visit | Attending: Physician Assistant | Admitting: Physician Assistant

## 2021-09-04 DIAGNOSIS — M25559 Pain in unspecified hip: Secondary | ICD-10-CM | POA: Diagnosis present

## 2021-12-30 ENCOUNTER — Other Ambulatory Visit (HOSPITAL_COMMUNITY): Payer: Self-pay | Admitting: Family Medicine

## 2021-12-30 DIAGNOSIS — Z1231 Encounter for screening mammogram for malignant neoplasm of breast: Secondary | ICD-10-CM

## 2022-01-05 ENCOUNTER — Other Ambulatory Visit: Payer: Self-pay

## 2022-01-05 ENCOUNTER — Ambulatory Visit (HOSPITAL_COMMUNITY)
Admission: RE | Admit: 2022-01-05 | Discharge: 2022-01-05 | Disposition: A | Discharge status: Home / Self Care | Payer: 59 | Source: Ambulatory Visit | Attending: Family Medicine | Admitting: Family Medicine

## 2022-01-05 DIAGNOSIS — Z1231 Encounter for screening mammogram for malignant neoplasm of breast: Secondary | ICD-10-CM | POA: Diagnosis not present

## 2022-08-07 DIAGNOSIS — M546 Pain in thoracic spine: Secondary | ICD-10-CM | POA: Diagnosis not present

## 2022-08-07 DIAGNOSIS — G44219 Episodic tension-type headache, not intractable: Secondary | ICD-10-CM | POA: Diagnosis not present

## 2022-08-07 DIAGNOSIS — M9901 Segmental and somatic dysfunction of cervical region: Secondary | ICD-10-CM | POA: Diagnosis not present

## 2022-08-07 DIAGNOSIS — M9902 Segmental and somatic dysfunction of thoracic region: Secondary | ICD-10-CM | POA: Diagnosis not present

## 2022-08-07 DIAGNOSIS — M542 Cervicalgia: Secondary | ICD-10-CM | POA: Diagnosis not present

## 2022-08-21 DIAGNOSIS — Z0001 Encounter for general adult medical examination with abnormal findings: Secondary | ICD-10-CM | POA: Diagnosis not present

## 2022-08-21 DIAGNOSIS — R5383 Other fatigue: Secondary | ICD-10-CM | POA: Diagnosis not present

## 2022-08-21 DIAGNOSIS — I10 Essential (primary) hypertension: Secondary | ICD-10-CM | POA: Diagnosis not present

## 2022-09-04 DIAGNOSIS — R5383 Other fatigue: Secondary | ICD-10-CM | POA: Diagnosis not present

## 2022-09-04 DIAGNOSIS — R232 Flushing: Secondary | ICD-10-CM | POA: Diagnosis not present

## 2022-09-11 DIAGNOSIS — M5412 Radiculopathy, cervical region: Secondary | ICD-10-CM | POA: Diagnosis not present

## 2022-09-11 DIAGNOSIS — M5416 Radiculopathy, lumbar region: Secondary | ICD-10-CM | POA: Diagnosis not present

## 2022-09-11 DIAGNOSIS — M47812 Spondylosis without myelopathy or radiculopathy, cervical region: Secondary | ICD-10-CM | POA: Diagnosis not present

## 2022-09-11 DIAGNOSIS — M47816 Spondylosis without myelopathy or radiculopathy, lumbar region: Secondary | ICD-10-CM | POA: Diagnosis not present

## 2022-09-18 ENCOUNTER — Other Ambulatory Visit (HOSPITAL_COMMUNITY): Payer: Self-pay | Admitting: Neurological Surgery

## 2022-09-18 DIAGNOSIS — M5416 Radiculopathy, lumbar region: Secondary | ICD-10-CM

## 2022-10-14 ENCOUNTER — Ambulatory Visit (HOSPITAL_COMMUNITY)
Admission: RE | Admit: 2022-10-14 | Discharge: 2022-10-14 | Disposition: A | Discharge status: Home / Self Care | Payer: 59 | Source: Ambulatory Visit | Attending: Neurological Surgery | Admitting: Neurological Surgery

## 2022-10-14 DIAGNOSIS — M47816 Spondylosis without myelopathy or radiculopathy, lumbar region: Secondary | ICD-10-CM | POA: Diagnosis not present

## 2022-10-14 DIAGNOSIS — M545 Low back pain, unspecified: Secondary | ICD-10-CM | POA: Diagnosis not present

## 2022-10-14 DIAGNOSIS — M5416 Radiculopathy, lumbar region: Secondary | ICD-10-CM | POA: Diagnosis not present

## 2022-10-21 DIAGNOSIS — M546 Pain in thoracic spine: Secondary | ICD-10-CM | POA: Diagnosis not present

## 2022-10-21 DIAGNOSIS — M9902 Segmental and somatic dysfunction of thoracic region: Secondary | ICD-10-CM | POA: Diagnosis not present

## 2022-10-21 DIAGNOSIS — M9903 Segmental and somatic dysfunction of lumbar region: Secondary | ICD-10-CM | POA: Diagnosis not present

## 2022-10-21 DIAGNOSIS — M62830 Muscle spasm of back: Secondary | ICD-10-CM | POA: Diagnosis not present

## 2022-10-21 DIAGNOSIS — M9905 Segmental and somatic dysfunction of pelvic region: Secondary | ICD-10-CM | POA: Diagnosis not present

## 2022-11-12 DIAGNOSIS — M4722 Other spondylosis with radiculopathy, cervical region: Secondary | ICD-10-CM | POA: Diagnosis not present

## 2022-11-12 DIAGNOSIS — M50122 Cervical disc disorder at C5-C6 level with radiculopathy: Secondary | ICD-10-CM | POA: Diagnosis not present

## 2022-11-12 DIAGNOSIS — M47816 Spondylosis without myelopathy or radiculopathy, lumbar region: Secondary | ICD-10-CM | POA: Diagnosis not present

## 2022-11-12 DIAGNOSIS — Z6830 Body mass index (BMI) 30.0-30.9, adult: Secondary | ICD-10-CM | POA: Diagnosis not present

## 2022-11-12 DIAGNOSIS — M4312 Spondylolisthesis, cervical region: Secondary | ICD-10-CM | POA: Diagnosis not present

## 2022-11-16 ENCOUNTER — Ambulatory Visit (HOSPITAL_COMMUNITY): Payer: 59 | Admitting: Physical Therapy
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
## 2022-12-15 ENCOUNTER — Ambulatory Visit (HOSPITAL_COMMUNITY): Payer: 59 | Attending: Neurological Surgery

## 2022-12-15 DIAGNOSIS — M47812 Spondylosis without myelopathy or radiculopathy, cervical region: Secondary | ICD-10-CM | POA: Diagnosis not present

## 2022-12-15 DIAGNOSIS — M542 Cervicalgia: Secondary | ICD-10-CM | POA: Insufficient documentation

## 2022-12-17 ENCOUNTER — Ambulatory Visit (HOSPITAL_COMMUNITY): Payer: 59 | Admitting: Physical Therapy

## 2022-12-17 DIAGNOSIS — M542 Cervicalgia: Secondary | ICD-10-CM

## 2022-12-17 DIAGNOSIS — M47812 Spondylosis without myelopathy or radiculopathy, cervical region: Secondary | ICD-10-CM
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
## 2022-12-24 ENCOUNTER — Ambulatory Visit (HOSPITAL_COMMUNITY): Payer: 59 | Admitting: Physical Therapy

## 2022-12-24 DIAGNOSIS — M542 Cervicalgia: Secondary | ICD-10-CM

## 2022-12-24 DIAGNOSIS — M47812 Spondylosis without myelopathy or radiculopathy, cervical region: Secondary | ICD-10-CM
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
## 2023-01-04 DIAGNOSIS — M4123 Other idiopathic scoliosis, cervicothoracic region: Secondary | ICD-10-CM | POA: Diagnosis not present

## 2023-01-04 DIAGNOSIS — I10 Essential (primary) hypertension: Secondary | ICD-10-CM | POA: Diagnosis not present

## 2023-01-04 DIAGNOSIS — G43709 Chronic migraine without aura, not intractable, without status migrainosus: Secondary | ICD-10-CM | POA: Diagnosis not present

## 2023-01-04 DIAGNOSIS — D518 Other vitamin B12 deficiency anemias: Secondary | ICD-10-CM | POA: Diagnosis not present

## 2023-01-04 DIAGNOSIS — Z6830 Body mass index (BMI) 30.0-30.9, adult: Secondary | ICD-10-CM | POA: Diagnosis not present

## 2023-01-04 DIAGNOSIS — M62830 Muscle spasm of back: Secondary | ICD-10-CM | POA: Diagnosis not present

## 2023-01-04 DIAGNOSIS — E6609 Other obesity due to excess calories: Secondary | ICD-10-CM | POA: Diagnosis not present

## 2023-01-04 DIAGNOSIS — E559 Vitamin D deficiency, unspecified: Secondary | ICD-10-CM | POA: Diagnosis not present

## 2023-01-14 DIAGNOSIS — M50122 Cervical disc disorder at C5-C6 level with radiculopathy: Secondary | ICD-10-CM | POA: Diagnosis not present

## 2023-01-14 DIAGNOSIS — M47816 Spondylosis without myelopathy or radiculopathy, lumbar region: Secondary | ICD-10-CM | POA: Diagnosis not present

## 2023-01-14 DIAGNOSIS — Z6830 Body mass index (BMI) 30.0-30.9, adult: Secondary | ICD-10-CM | POA: Diagnosis not present

## 2023-01-22 DIAGNOSIS — M542 Cervicalgia: Secondary | ICD-10-CM | POA: Diagnosis not present

## 2023-01-22 DIAGNOSIS — M9901 Segmental and somatic dysfunction of cervical region: Secondary | ICD-10-CM | POA: Diagnosis not present

## 2023-01-22 DIAGNOSIS — M546 Pain in thoracic spine: Secondary | ICD-10-CM | POA: Diagnosis not present

## 2023-01-22 DIAGNOSIS — M9902 Segmental and somatic dysfunction of thoracic region: Secondary | ICD-10-CM | POA: Diagnosis not present

## 2023-02-10 DIAGNOSIS — M47816 Spondylosis without myelopathy or radiculopathy, lumbar region: Secondary | ICD-10-CM | POA: Diagnosis not present

## 2023-02-10 DIAGNOSIS — M4312 Spondylolisthesis, cervical region: Secondary | ICD-10-CM | POA: Diagnosis not present

## 2023-02-10 DIAGNOSIS — Z6830 Body mass index (BMI) 30.0-30.9, adult: Secondary | ICD-10-CM | POA: Diagnosis not present

## 2023-02-10 DIAGNOSIS — M50122 Cervical disc disorder at C5-C6 level with radiculopathy: Secondary | ICD-10-CM | POA: Diagnosis not present

## 2023-03-26 DIAGNOSIS — M50122 Cervical disc disorder at C5-C6 level with radiculopathy: Secondary | ICD-10-CM | POA: Diagnosis not present

## 2023-03-26 DIAGNOSIS — M4312 Spondylolisthesis, cervical region: Secondary | ICD-10-CM | POA: Diagnosis not present

## 2023-03-26 DIAGNOSIS — M47816 Spondylosis without myelopathy or radiculopathy, lumbar region: Secondary | ICD-10-CM | POA: Diagnosis not present

## 2023-04-09 ENCOUNTER — Other Ambulatory Visit (HOSPITAL_COMMUNITY): Payer: Self-pay | Admitting: Family Medicine

## 2023-04-09 DIAGNOSIS — Z1231 Encounter for screening mammogram for malignant neoplasm of breast: Secondary | ICD-10-CM

## 2023-04-16 ENCOUNTER — Ambulatory Visit (HOSPITAL_COMMUNITY)
Admission: RE | Admit: 2023-04-16 | Discharge: 2023-04-16 | Disposition: A | Discharge status: Home / Self Care | Payer: 59 | Source: Ambulatory Visit | Attending: Family Medicine | Admitting: Family Medicine

## 2023-04-16 DIAGNOSIS — Z1231 Encounter for screening mammogram for malignant neoplasm of breast: Secondary | ICD-10-CM | POA: Diagnosis not present

## 2023-07-15 ENCOUNTER — Other Ambulatory Visit: Payer: Self-pay

## 2023-07-15 ENCOUNTER — Encounter: Payer: Self-pay | Admitting: Emergency Medicine

## 2023-07-15 ENCOUNTER — Ambulatory Visit
Admission: EM | Admit: 2023-07-15 | Discharge: 2023-07-15 | Disposition: A | Discharge status: Home / Self Care | Payer: 59

## 2023-07-15 DIAGNOSIS — U071 COVID-19: Secondary | ICD-10-CM | POA: Diagnosis not present

## 2023-07-15 MED ORDER — PAXLOVID (300/100) 20 X 150 MG & 10 X 100MG PO TBPK: 3.0000 | ORAL_TABLET | Freq: Two times a day (BID) | ORAL | 0 refills | Status: AC

## 2023-07-15 NOTE — ED Triage Notes (Signed)
Pt reports tested positive covid test today. Pt reports has had cough, runny nose, sneezing, headache since Tuesday.

## 2023-07-15 NOTE — ED Provider Notes (Signed)
[facility]    CSN: [id number] Arrival date & time: 07/15/23  1250      History   Chief Complaint Chief Complaint  Patient presents with   Cough    HPI Emily Hawkins is a 62 y.o. female.   Patient presenting today with 2-day history of cough, runny nose, sneezing, headache, fatigue.  Denies fever, chest pain, shortness of breath, abdominal pain, nausea vomiting or diarrhea.  So far not trying anything over-the-counter for symptoms.  Positive home COVID test today.  No known history of chronic pulmonary disease.    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 03/05/2020   S/P VH (vaginal hysterectomy) 03/05/2020    Past Surgical History:  Procedure Laterality Date   ABDOMINAL SURGERY     lacerated liver repaired   ANTERIOR AND POSTERIOR REPAIR N/A 03/05/2020   Procedure: ANTERIOR (CYSTOCELE) AND POSTERIOR REPAIR (RECTOCELE);  Surgeon: Tilda Burrow, MD;  Location: [facility];  Service: Gynecology;  Laterality: N/A;   CARDIAC CATHETERIZATION     CHOLECYSTECTOMY  10 yrs ago   EXTRACORPOREAL SHOCK WAVE LITHOTRIPSY Left 04/12/2013   Procedure: EXTRACORPOREAL SHOCK WAVE LITHOTRIPSY (ESWL) LEFT RENAL CALCULUS;  Surgeon: Ky Barban, MD;  Location: [facility];  Service: Urology;  Laterality: Left;   LASER ABLATION     uterus   NOSE SURGERY     TUBAL LIGATION     VAGINAL HYSTERECTOMY Bilateral 03/05/2020   Procedure: HYSTERECTOMY VAGINAL;  Surgeon: Tilda Burrow, MD;  Location: [facility];  Service: Gynecology;  Laterality: Bilateral;    OB  History     Gravida  2   Para  2   Term  2   Preterm      AB      Living  2      SAB      IAB      Ectopic      Multiple      Live Births  2            Home Medications    Prior to Admission medications   Medication Sig Start Date End Date Taking? Authorizing Provider  estrogens, conjugated, (PREMARIN) 0.625 MG tablet Take 0.625 mg by mouth daily. Take daily for 21 days then do not take for 7 days.   Yes [provider]  nirmatrelvir & ritonavir (PAXLOVID, 300/100,) 20 x 150 MG & 10 x 100MG  TBPK Take 3 tablets by mouth 2 (two) times daily for 5 days. 07/15/23 07/20/23 Yes Particia Nearing, PA-C  ALPRAZolam Prudy Feeler) 1 MG tablet Take 1 mg by mouth daily as needed (anxiety).  01/08/20   [provider]  atorvastatin (LIPITOR) 20 MG tablet Take 20 mg by mouth at bedtime.     [provider]  doxycycline (VIBRAMYCIN) 100 MG capsule Take 1 capsule (100 mg total) by mouth 2 (two) times daily. 05/26/21   Bing Neighbors, NP  lisinopril (PRINIVIL,ZESTRIL) 10 MG tablet Take 10 mg by mouth at bedtime.  [provider]  predniSONE (DELTASONE) 20 MG tablet Take 2 PO QAM  X 3 days then  1 PO QAM x 3 days 05/13/21   Moshe Cipro, NP  triamcinolone cream (KENALOG) 0.1 % Apply 1 application topically 2 (two) times daily. 05/12/21   Bing Neighbors, NP  valACYclovir (VALTREX) 500 MG tablet Take 500 mg by mouth daily as needed (outbreaks).  01/22/20   [provider]  zonisamide (ZONEGRAN) 100 MG capsule Take 100 mg by mouth daily. 08/18/20   [provider]    Family History Family History  Problem Relation Age of Onset   Hypertension Sister    Asthma Sister     Social History Social History   Tobacco Use   Smoking status: Never   Smokeless tobacco: Never  Vaping Use   Vaping status: Never Used  Substance Use Topics   Alcohol use: No   Drug use: No     Allergies   Flagyl [metronidazole hcl], Latex,  Oxycodone, Penicillins, and Sulfa antibiotics   Review of Systems Review of Systems Per HPI  Physical Exam Triage Vital Signs ED Triage Vitals  Encounter Vitals Group     BP 07/15/23 1313 120/72     Systolic BP Percentile --      Diastolic BP Percentile --      Pulse Rate 07/15/23 1313 65     Resp 07/15/23 1313 20     Temp 07/15/23 1313 98.4 F (36.9 C)     Temp Source 07/15/23 1313 Oral     SpO2 07/15/23 1313 98 %     Weight --      Height --      Head Circumference --      Peak Flow --      Pain Score 07/15/23 1308 9     Pain Loc --      Pain Education --      Exclude from Growth Chart --    No data found.  Updated Vital Signs BP 120/72 (BP Location: Right Arm)   Pulse 65   Temp 98.4 F (36.9 C) (Oral)   Resp 20   LMP 10/10/2011   SpO2 98%   Visual Acuity Right Eye Distance:   Left Eye Distance:   Bilateral Distance:    Right Eye Near:   Left Eye Near:    Bilateral Near:     Physical Exam Vitals and nursing note reviewed.  Constitutional:      Appearance: Normal appearance.  HENT:     Head: Atraumatic.     Right Ear: Tympanic membrane and external ear normal.     Left Ear: Tympanic membrane and external ear normal.     Nose: Congestion present.     Mouth/Throat:     Mouth: Mucous membranes are moist.     Pharynx: Posterior oropharyngeal erythema present.  Eyes:     Extraocular Movements: Extraocular movements intact.     Conjunctiva/sclera: Conjunctivae normal.  Cardiovascular:     Rate and Rhythm: Normal rate and regular rhythm.     Heart sounds: Normal heart sounds.  Pulmonary:     Effort: Pulmonary effort is normal.     Breath sounds: Normal breath sounds. No wheezing or rales.  Musculoskeletal:        General: Normal range of motion.     Cervical back: Normal range of motion and neck supple.  Skin:    General: Skin is warm and dry.  Neurological:     Mental  Status: She is alert and oriented to person, place, and time.  Psychiatric:         Mood and Affect: Mood normal.        Thought Content: Thought content normal.      UC Treatments / Results  Labs (all labs ordered are listed, but only abnormal results are displayed) Labs Reviewed - No data to display  EKG   Radiology No results found.  Procedures Procedures (including critical care time)  Medications Ordered in UC Medications - No data to display  Initial Impression / Assessment and Plan / UC Course  I have reviewed the triage vital signs and the nursing notes.  Pertinent labs & imaging results that were available during my care of the patient were reviewed by me and considered in my medical decision making (see chart for details).     Vitals and exam overall reassuring and consistent with COVID-19 diagnosis.  Treat with Paxlovid, cold congestion medications and supportive home care.  Return for worsening symptoms.  Work note given.  Final Clinical Impressions(s) / UC Diagnoses   Final diagnoses:  COVID-19   Discharge Instructions   None    ED Prescriptions     Medication Sig Dispense Auth. Provider   nirmatrelvir & ritonavir (PAXLOVID, 300/100,) 20 x 150 MG & 10 x 100MG  TBPK Take 3 tablets by mouth 2 (two) times daily for 5 days. 30 tablet Particia Nearing, [place]      PDMP not reviewed this encounter.   Particia Nearing, [place] 07/15/23 1327

## 2023-08-26 DIAGNOSIS — Z6828 Body mass index (BMI) 28.0-28.9, adult: Secondary | ICD-10-CM | POA: Diagnosis not present

## 2023-08-26 DIAGNOSIS — Z0001 Encounter for general adult medical examination with abnormal findings: Secondary | ICD-10-CM | POA: Diagnosis not present

## 2023-08-26 DIAGNOSIS — D518 Other vitamin B12 deficiency anemias: Secondary | ICD-10-CM | POA: Diagnosis not present

## 2023-08-26 DIAGNOSIS — I10 Essential (primary) hypertension: Secondary | ICD-10-CM | POA: Diagnosis not present

## 2023-08-26 DIAGNOSIS — Z1331 Encounter for screening for depression: Secondary | ICD-10-CM | POA: Diagnosis not present

## 2023-08-26 DIAGNOSIS — M4123 Other idiopathic scoliosis, cervicothoracic region: Secondary | ICD-10-CM | POA: Diagnosis not present

## 2023-08-26 DIAGNOSIS — E559 Vitamin D deficiency, unspecified: Secondary | ICD-10-CM | POA: Diagnosis not present

## 2024-03-10 ENCOUNTER — Ambulatory Visit (HOSPITAL_COMMUNITY)
Admission: RE | Admit: 2024-03-10 | Discharge: 2024-03-10 | Disposition: A | Discharge status: Home / Self Care | Source: Ambulatory Visit | Attending: Family Medicine | Admitting: Family Medicine

## 2024-03-10 ENCOUNTER — Other Ambulatory Visit (HOSPITAL_COMMUNITY): Payer: Self-pay | Admitting: Family Medicine

## 2024-03-10 DIAGNOSIS — R042 Hemoptysis: Secondary | ICD-10-CM | POA: Insufficient documentation

## 2024-04-03 ENCOUNTER — Ambulatory Visit
Admission: EM | Admit: 2024-04-03 | Discharge: 2024-04-03 | Disposition: A | Discharge status: Home / Self Care | Attending: Nurse Practitioner | Admitting: Nurse Practitioner

## 2024-04-03 ENCOUNTER — Other Ambulatory Visit: Payer: Self-pay

## 2024-04-03 DIAGNOSIS — R079 Chest pain, unspecified: Secondary | ICD-10-CM

## 2024-04-03 DIAGNOSIS — R5383 Other fatigue: Secondary | ICD-10-CM | POA: Diagnosis not present

## 2024-04-03 MED ORDER — LIDOCAINE VISCOUS HCL 2 % MT SOLN
15.0000 mL | Freq: Once | OROMUCOSAL | Status: AC
  Administered 2024-04-03: 15 mL via OROMUCOSAL

## 2024-04-03 MED ORDER — ALUM & MAG HYDROXIDE-SIMETH 200-200-20 MG/5ML PO SUSP
30.0000 mL | Freq: Once | ORAL | Status: AC
  Administered 2024-04-03: 30 mL via ORAL

## 2024-04-03 NOTE — ED Triage Notes (Signed)
 Pt reports chest pain in between the breast , burning pain, SOB, weakness onset Friday. Still feeling weak and tired, was on Facebook live when this occurred pain stayed consistent throughout the night, into Saturday. Feels almost like an ache.

## 2024-04-03 NOTE — Discharge Instructions (Signed)
 I have ordered lab work for you.  You will be contacted if the pending lab results are abnormal.  You also have access to your results via MyChart. Increase fluids and allow for plenty of rest. You may take over-the-counter Tylenol  as needed for pain, fever, or general discomfort. Avoid strenuous activity at this time. Make sure you are eating a well-balanced diet. Go to the emergency department immediately if you experience worsening chest pain, shortness of breath, difficulty breathing, or other concerns. Please follow-up with your PCP within the next 7 to 10 days for reevaluation. Follow-up as needed.

## 2024-04-03 NOTE — ED Provider Notes (Signed)
 [facility]    CSN: [id number] Arrival date & time: 04/03/24  1610      History   Chief Complaint No chief complaint on file.   HPI Emily Hawkins is a 63 y.o. female.   The history is provided by the patient.   Patient presents for complaints of chest pain and shortness of breath that started approximately 3 days ago.  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  Patient states the pain was intense, and she had to rub her chest to help alleviate the pain.  She states since that time, she has continued to experience a "dull ache" in her chest, and increased fatigue.  Patient states that she also is short of breath when she is both moving around and when she is at rest.  She denies nausea, vomiting, diaphoresis, or difficulty breathing.  Patient denies prior history of reflux disease.  She states that this is the "worst."  She is experience before.  Per review of her chart, patient has been seen by cardiology for chest pain in the past.  Patient was last seen in 2015, at that time, she underwent a left heart cath and also had an abnormal stress test, procedures were done through [facility].  Denies history of smoking or alcohol use.  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 03/05/2020   S/P VH (vaginal hysterectomy) 03/05/2020    Past Surgical History:  Procedure Laterality Date   ABDOMINAL SURGERY     lacerated liver repaired   ANTERIOR AND POSTERIOR REPAIR N/A 03/05/2020   Procedure: ANTERIOR  (CYSTOCELE) AND POSTERIOR REPAIR (RECTOCELE);  Surgeon: Albino Hum, MD;  Location: [facility];  Service: Gynecology;  Laterality: N/A;   CARDIAC CATHETERIZATION     CHOLECYSTECTOMY  10 yrs ago   EXTRACORPOREAL SHOCK WAVE LITHOTRIPSY Left 04/12/2013   Procedure: EXTRACORPOREAL SHOCK WAVE LITHOTRIPSY (ESWL) LEFT RENAL CALCULUS;  Surgeon: Reggie Caper, MD;  Location: [facility];  Service: Urology;  Laterality: Left;   LASER ABLATION     uterus   NOSE SURGERY     TUBAL LIGATION     VAGINAL HYSTERECTOMY Bilateral 03/05/2020   Procedure: HYSTERECTOMY VAGINAL;  Surgeon: Albino Hum, MD;  Location: [facility];  Service: Gynecology;  Laterality: Bilateral;    OB History     Gravida  2   Para  2   Term  2   Preterm      AB      Living  2      SAB      IAB      Ectopic      Multiple      Live Births  2            Home Medications    Prior to Admission medications   Medication Sig Start Date End Date Taking? Authorizing Provider  ALPRAZolam (XANAX) 1 MG tablet Take 1 mg by mouth daily as needed (anxiety).  01/08/20  [provider]  atorvastatin (LIPITOR) 20 MG tablet Take 20 mg by mouth at bedtime.     [provider]  doxycycline  (VIBRAMYCIN ) 100 MG capsule Take 1 capsule (100 mg total) by mouth 2 (two) times daily. 05/26/21   Buena Carmine, NP  estrogens , conjugated, (PREMARIN ) 0.625 MG tablet Take 0.625 mg by mouth daily. Take daily for 21 days then do not take for 7 days.    [provider]  lisinopril  (PRINIVIL ,ZESTRIL ) 10 MG tablet Take 10 mg by mouth at bedtime.     [provider]  predniSONE  (DELTASONE ) 20 MG tablet Take 2 PO QAM  X 3 days then  1 PO QAM x 3 days 05/13/21   Wellington Half, FNP  triamcinolone  cream (KENALOG ) 0.1 % Apply 1 application topically 2 (two) times daily. 05/12/21   Buena Carmine, NP  valACYclovir (VALTREX) 500 MG tablet Take 500 mg by mouth daily as needed (outbreaks).  01/22/20    [provider]  zonisamide (ZONEGRAN) 100 MG capsule Take 100 mg by mouth daily. 08/18/20   [provider]    Family History Family History  Problem Relation Age of Onset   Hypertension Sister    Asthma Sister     Social History Social History   Tobacco Use   Smoking status: Never   Smokeless tobacco: Never  Vaping Use   Vaping status: Never Used  Substance Use Topics   Alcohol use: No   Drug use: No     Allergies   Flagyl [metronidazole hcl], Latex, Oxycodone, Penicillins, and Sulfa antibiotics   Review of Systems Review of Systems Per HPI  Physical Exam Triage Vital Signs ED Triage Vitals  Encounter Vitals Group     BP 04/03/24 1616 (!) 144/85     Systolic BP Percentile --      Diastolic BP Percentile --      Pulse Rate 04/03/24 1616 64     Resp 04/03/24 1616 18     Temp 04/03/24 1616 98 F (36.7 C)     Temp Source 04/03/24 1616 Oral     SpO2 04/03/24 1616 98 %     Weight --      Height --      Head Circumference --      Peak Flow --      Pain Score 04/03/24 1618 4     Pain Loc --      Pain Education --      Exclude from Growth Chart --    No data found.  Updated Vital Signs BP (!) 144/85 (BP Location: Right Arm)   Pulse 64   Temp 98 F (36.7 C) (Oral)   Resp 18   LMP 10/10/2011   SpO2 98%   Visual Acuity Right Eye Distance:   Left Eye Distance:   Bilateral Distance:    Right Eye Near:   Left Eye Near:    Bilateral Near:     Physical Exam Vitals and nursing note reviewed.  Constitutional:      General: She is not in acute distress.    Appearance: Normal appearance.  HENT:     Head: Normocephalic.  Eyes:     Extraocular Movements: Extraocular movements intact.     Conjunctiva/sclera: Conjunctivae normal.     Pupils: Pupils are equal, round, and reactive to light.  Cardiovascular:     Rate and Rhythm: Normal rate and regular rhythm.     Pulses: Normal pulses.  Heart sounds: Normal heart sounds.  Pulmonary:      Effort: Pulmonary effort is normal. No respiratory distress.     Breath sounds: Normal breath sounds. No stridor. No wheezing, rhonchi or rales.  Abdominal:     General: Bowel sounds are normal.     Palpations: Abdomen is soft.     Tenderness: There is no abdominal tenderness.  Musculoskeletal:     Cervical back: Normal range of motion.  Lymphadenopathy:     Cervical: No cervical adenopathy.  Skin:    General: Skin is warm and dry.  Neurological:     General: No focal deficit present.     Mental Status: She is alert and oriented to person, place, and time.  Psychiatric:        Mood and Affect: Mood normal.        Behavior: Behavior normal.      UC Treatments / Results  Labs (all labs ordered are listed, but only abnormal results are displayed) Labs Reviewed  CBC WITH DIFFERENTIAL/PLATELET  COMPREHENSIVE METABOLIC PANEL WITH GFR    EKG   Radiology No results found.  Procedures Procedures (including critical care time)  Medications Ordered in UC Medications  alum & mag hydroxide-simeth (MAALOX/MYLANTA) 200-200-20 MG/5ML suspension 30 mL (30 mLs Oral Given 04/03/24 1649)  lidocaine  (XYLOCAINE ) 2 % viscous mouth solution 15 mL (15 mLs Mouth/Throat Given 04/03/24 1649)    Initial Impression / Assessment and Plan / UC Course  I have reviewed the triage vital signs and the nursing notes.  Pertinent labs & imaging results that were available during my care of the patient were reviewed by me and considered in my medical decision making (see chart for details).  Reviewed patient's chart, patient seen by cardiology in 2015, stress echo was performed which was negative.  Patient has not followed up with cardiology since that time.  GI cocktail was administered with minimal relief in patient's symptoms.  EKG did not show an obvious STEMI, unable to perform a chest x-ray due to staffing unavailability.  Discussion with patient that it is my recommendation that she go to the  emergency department for further evaluation and possible treatment.  Patient did not agree with recommendation and states that she will go if she begins to feel worse.  Obtained CBC and CMP for safety. Supportive care recommendations were provided and discussed with the patient to include rest and immediate follow-up if symptoms worsen.  Patient is also advised to follow-up with her PCP within the next 7 to 10 days for reevaluation.  Patient was in agreement with this plan of care and verbalized understanding.  All questions were answered.  Patient stable for discharge.  Final Clinical Impressions(s) / UC Diagnoses   Final diagnoses:  Chest pain, unspecified type  Other fatigue     Discharge Instructions      I have ordered lab work for you.  You will be contacted if the pending lab results are abnormal.  You also have access to your results via MyChart. Increase fluids and allow for plenty of rest. You may take over-the-counter Tylenol  as needed for pain, fever, or general discomfort. Avoid strenuous activity at this time. Make sure you are eating a well-balanced diet. Go to the emergency department immediately if you experience worsening chest pain, shortness of breath, difficulty breathing, or other concerns. Please follow-up with your PCP within the next 7 to 10 days for reevaluation. Follow-up as needed.     ED Prescriptions   None  PDMP not reviewed this encounter.   Hardy Lia, NP 04/03/24 1933

## 2024-04-04 LAB — COMPREHENSIVE METABOLIC PANEL WITH GFR
ALT: 34 IU/L — ABNORMAL HIGH (ref 0–32)
AST: 24 IU/L (ref 0–40)
Albumin: 4.6 g/dL (ref 3.9–4.9)
Alkaline Phosphatase: 140 IU/L — ABNORMAL HIGH (ref 44–121)
BUN/Creatinine Ratio: 8 — ABNORMAL LOW (ref 12–28)
BUN: 6 mg/dL — ABNORMAL LOW (ref 8–27)
Bilirubin Total: 0.5 mg/dL (ref 0.0–1.2)
CO2: 24 mmol/L (ref 20–29)
Calcium: 9.8 mg/dL (ref 8.7–10.3)
Chloride: 106 mmol/L (ref 96–106)
Creatinine, Ser: 0.72 mg/dL (ref 0.57–1.00)
Globulin, Total: 2.5 g/dL (ref 1.5–4.5)
Glucose: 81 mg/dL (ref 70–99)
Potassium: 4.3 mmol/L (ref 3.5–5.2)
Sodium: 142 mmol/L (ref 134–144)
Total Protein: 7.1 g/dL (ref 6.0–8.5)
eGFR: 95 mL/min/{1.73_m2} (ref >59)

## 2024-04-04 LAB — CBC WITH DIFFERENTIAL/PLATELET
Basophils Absolute: 0 10*3/uL (ref 0.0–0.2)
Basos: 0 %
EOS (ABSOLUTE): 0.1 10*3/uL (ref 0.0–0.4)
Eos: 2 %
Hematocrit: 46.7 % — ABNORMAL HIGH (ref 34.0–46.6)
Hemoglobin: 15.9 g/dL (ref 11.1–15.9)
Immature Grans (Abs): 0 10*3/uL (ref 0.0–0.1)
Immature Granulocytes: 0 %
Lymphocytes Absolute: 2.2 10*3/uL (ref 0.7–3.1)
Lymphs: 29 %
MCH: 30.1 pg (ref 26.6–33.0)
MCHC: 34 g/dL (ref 31.5–35.7)
MCV: 88 fL (ref 79–97)
Monocytes Absolute: 0.6 10*3/uL (ref 0.1–0.9)
Monocytes: 7 %
Neutrophils Absolute: 4.6 10*3/uL (ref 1.4–7.0)
Neutrophils: 62 %
Platelets: 190 10*3/uL (ref 150–450)
RBC: 5.29 x10E6/uL — ABNORMAL HIGH (ref 3.77–5.28)
RDW: 12.6 % (ref 11.7–15.4)
WBC: 7.5 10*3/uL (ref 3.4–10.8)

## 2024-05-22 ENCOUNTER — Other Ambulatory Visit (HOSPITAL_COMMUNITY): Payer: Self-pay | Admitting: Family Medicine

## 2024-05-22 DIAGNOSIS — Z1231 Encounter for screening mammogram for malignant neoplasm of breast: Secondary | ICD-10-CM

## 2024-05-26 ENCOUNTER — Ambulatory Visit (HOSPITAL_COMMUNITY)
Admission: RE | Admit: 2024-05-26 | Discharge: 2024-05-26 | Disposition: A | Discharge status: Home / Self Care | Source: Ambulatory Visit | Attending: Family Medicine | Admitting: Family Medicine

## 2024-05-26 DIAGNOSIS — Z1231 Encounter for screening mammogram for malignant neoplasm of breast: Secondary | ICD-10-CM | POA: Insufficient documentation

## 2024-07-08 ENCOUNTER — Ambulatory Visit
Admission: EM | Admit: 2024-07-08 | Discharge: 2024-07-08 | Disposition: A | Discharge status: Home / Self Care | Attending: Family Medicine | Admitting: Family Medicine

## 2024-07-08 DIAGNOSIS — L03111 Cellulitis of right axilla: Secondary | ICD-10-CM | POA: Diagnosis not present

## 2024-07-08 MED ORDER — DOXYCYCLINE HYCLATE 100 MG PO CAPS: 100.0000 mg | ORAL_CAPSULE | Freq: Two times a day (BID) | ORAL | 0 refills | Status: DC

## 2024-07-08 NOTE — ED Triage Notes (Signed)
 Pt reports ingrown hair under right arm pit that she believes has gotten infected is now causing pain into the right breast

## 2024-07-08 NOTE — ED Provider Notes (Signed)
 [facility]    CSN: [id number] Arrival date & time: 07/08/24  9046      History   Chief Complaint No chief complaint on file.   HPI Emily Hawkins is a 63 y.o. female.   Patient presenting today with what she thinks initially started as an ingrown hair to the right underarm and is now causing redness, swelling and significant pain down toward the right breast.  Denies fever, chills, body aches, bleeding, drainage, known injury to the area.  So far trying over-the-counter pain relievers with minimal relief.    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 03/05/2020   S/P VH (vaginal hysterectomy) 03/05/2020    Past Surgical History:  Procedure Laterality Date   ABDOMINAL SURGERY     lacerated liver repaired   ANTERIOR AND POSTERIOR REPAIR N/A 03/05/2020   Procedure: ANTERIOR (CYSTOCELE) AND POSTERIOR REPAIR (RECTOCELE);  Surgeon: Edsel Norleen GAILS, MD;  Location: [facility];  Service: Gynecology;  Laterality: N/A;   CARDIAC CATHETERIZATION     CHOLECYSTECTOMY  10 yrs ago   EXTRACORPOREAL SHOCK WAVE LITHOTRIPSY Left 04/12/2013   Procedure: EXTRACORPOREAL SHOCK WAVE LITHOTRIPSY (ESWL) LEFT RENAL CALCULUS;  Surgeon: Emery LILLETTE Blaze, MD;  Location: [facility];  Service: Urology;  Laterality: Left;   LASER ABLATION     uterus   NOSE SURGERY     TUBAL LIGATION     VAGINAL HYSTERECTOMY Bilateral 03/05/2020   Procedure: HYSTERECTOMY VAGINAL;  Surgeon: Edsel Norleen GAILS, MD;  Location: [facility];  Service: Gynecology;  Laterality: Bilateral;    OB History      Gravida  2   Para  2   Term  2   Preterm      AB      Living  2      SAB      IAB      Ectopic      Multiple      Live Births  2            Home Medications    Prior to Admission medications   Medication Sig Start Date End Date Taking? Authorizing Provider  doxycycline  (VIBRAMYCIN ) 100 MG capsule Take 1 capsule (100 mg total) by mouth 2 (two) times daily. 07/08/24  Yes Stuart Vernell Norris, PA-C  ALPRAZolam (XANAX) 1 MG tablet Take 1 mg by mouth daily as needed (anxiety).  01/08/20   [provider]  atorvastatin (LIPITOR) 20 MG tablet Take 20 mg by mouth at bedtime.     [provider]  doxycycline  (VIBRAMYCIN ) 100 MG capsule Take 1 capsule (100 mg total) by mouth 2 (two) times daily. 05/26/21   Arloa Suzen RAMAN, NP  estrogens , conjugated, (PREMARIN ) 0.625 MG tablet Take 0.625 mg by mouth daily. Take daily for 21 days then do not take for 7 days.    [provider]  lisinopril  (PRINIVIL ,ZESTRIL ) 10 MG tablet Take 10 mg by mouth at bedtime.     [provider]  predniSONE  (DELTASONE ) 20 MG tablet  Take 2 PO QAM  X 3 days then  1 PO QAM x 3 days 05/13/21   Alvia Corean CROME, FNP  triamcinolone  cream (KENALOG ) 0.1 % Apply 1 application topically 2 (two) times daily. 05/12/21   Arloa Suzen RAMAN, NP  valACYclovir (VALTREX) 500 MG tablet Take 500 mg by mouth daily as needed (outbreaks).  01/22/20   [provider]  zonisamide (ZONEGRAN) 100 MG capsule Take 100 mg by mouth daily. 08/18/20   [provider]    Family History Family History  Problem Relation Age of Onset   Hypertension Sister    Asthma Sister     Social History Social History   Tobacco Use   Smoking status: Never   Smokeless tobacco: Never  Vaping Use   Vaping status: Never Used  Substance Use Topics   Alcohol use: No   Drug use: No     Allergies   Flagyl [metronidazole hcl], Latex, Oxycodone, Penicillins, and Sulfa  antibiotics   Review of Systems Review of Systems Per HPI  Physical Exam Triage Vital Signs ED Triage Vitals  Encounter Vitals Group     BP 07/08/24 1022 109/75     Girls Systolic BP Percentile --      Girls Diastolic BP Percentile --      Boys Systolic BP Percentile --      Boys Diastolic BP Percentile --      Pulse Rate 07/08/24 1022 77     Resp 07/08/24 1022 18     Temp 07/08/24 1022 98 F (36.7 C)     Temp Source 07/08/24 1022 Oral     SpO2 07/08/24 1022 98 %     Weight --      Height --      Head Circumference --      Peak Flow --      Pain Score 07/08/24 1025 10     Pain Loc --      Pain Education --      Exclude from Growth Chart --    No data found.  Updated Vital Signs BP 109/75 (BP Location: Right Arm)   Pulse 77   Temp 98 F (36.7 C) (Oral)   Resp 18   LMP 10/10/2011   SpO2 98%   Visual Acuity Right Eye Distance:   Left Eye Distance:   Bilateral Distance:    Right Eye Near:   Left Eye Near:    Bilateral Near:     Physical Exam Vitals and nursing note reviewed.  Constitutional:      Appearance: Normal appearance. She is not ill-appearing.  HENT:     Head: Atraumatic.  Eyes:     Extraocular Movements: Extraocular movements intact.     Conjunctiva/sclera: Conjunctivae normal.  Cardiovascular:     Rate and Rhythm: Normal rate.  Pulmonary:     Effort: Pulmonary effort is normal.  Musculoskeletal:        General: Normal range of motion.     Cervical back: Normal range of motion and neck supple.  Skin:    General: Skin is warm and dry.     Findings: Erythema present.     Comments: Painful firm area of tenderness to the right axilla with surrounding erythema down toward the right lateral breast.  Nonfluctuant, no induration, no active bleeding or drainage  Neurological:     Mental Status: She is alert and oriented to person, place, and time.  Psychiatric:        Mood and  Affect: Mood normal.        Thought Content: Thought content normal.         Judgment: Judgment normal.      UC Treatments / Results  Labs (all labs ordered are listed, but only abnormal results are displayed) Labs Reviewed - No data to display  EKG   Radiology No results found.  Procedures Procedures (including critical care time)  Medications Ordered in UC Medications - No data to display  Initial Impression / Assessment and Plan / UC Course  I have reviewed the triage vital signs and the nursing notes.  Pertinent labs & imaging results that were available during my care of the patient were reviewed by me and considered in my medical decision making (see chart for details).     Cellulitis and early abscess of the axillary region, will forego I&D at this time as the area is firm, deep and treat with doxycycline , warm compresses, over-the-counter pain relievers.  Return for worsening symptoms.  Final Clinical Impressions(s) / UC Diagnoses   Final diagnoses:  Cellulitis of axilla, right   Discharge Instructions   None    ED Prescriptions     Medication Sig Dispense Auth. Provider   doxycycline  (VIBRAMYCIN ) 100 MG capsule Take 1 capsule (100 mg total) by mouth 2 (two) times daily. 20 capsule Stuart Vernell Norris, [place]      PDMP not reviewed this encounter.   Stuart Vernell Norris, [place] 07/08/24 1215

## 2024-08-07 ENCOUNTER — Telehealth: Payer: Self-pay

## 2024-08-07 NOTE — Telephone Encounter (Signed)
 LVM to inform pt.

## 2024-08-07 NOTE — Telephone Encounter (Signed)
 Copied from CRM #[id number]. Topic: Medical Record Request - Other >> Aug 07, 2024 11:19 AM Precious C wrote: Reason for CRM: Patient called to establish a new patient appointment. After scheduling, patient asked how her new provider would obtain her medical records, as her previous provider's office is closing on September 5. I informed the patient that I would send a note to the new provider(([facility]) to notify them of the upcoming closure and request that they expedite obtaining her medical records from her old provider.

## 2024-10-06 ENCOUNTER — Ambulatory Visit
Admission: EM | Admit: 2024-10-06 | Discharge: 2024-10-06 | Disposition: A | Discharge status: Home / Self Care | Attending: Nurse Practitioner | Admitting: Nurse Practitioner

## 2024-10-06 DIAGNOSIS — Z8679 Personal history of other diseases of the circulatory system: Secondary | ICD-10-CM

## 2024-10-06 DIAGNOSIS — Z760 Encounter for issue of repeat prescription: Secondary | ICD-10-CM

## 2024-10-06 DIAGNOSIS — Z8639 Personal history of other endocrine, nutritional and metabolic disease: Secondary | ICD-10-CM | POA: Diagnosis not present

## 2024-10-06 DIAGNOSIS — Z8669 Personal history of other diseases of the nervous system and sense organs: Secondary | ICD-10-CM

## 2024-10-06 MED ORDER — ATORVASTATIN CALCIUM 20 MG PO TABS: 20.0000 mg | ORAL_TABLET | Freq: Every day | ORAL | 0 refills | Status: DC

## 2024-10-06 MED ORDER — ZONISAMIDE 100 MG PO CAPS: 100.0000 mg | ORAL_CAPSULE | Freq: Every day | ORAL | 0 refills | Status: DC

## 2024-10-06 MED ORDER — LISINOPRIL 10 MG PO TABS: 10.0000 mg | ORAL_TABLET | Freq: Every day | ORAL | 0 refills | Status: DC

## 2024-10-06 NOTE — ED Provider Notes (Signed)
 [facility]    CSN: [id number] Arrival date & time: 10/06/24  1029      History   Chief Complaint No chief complaint on file.   HPI Emily Hawkins is a 63 y.o. female.   The history is provided by the patient.   Patient presents for medication refill.  Patient is requesting refills for atorvastatin, lisinopril , and zonisamide.  Patient states that she is not out of her medications, but will be in the next few days.  She denies chest pain, shortness of breath, difficulty breathing, abdominal pain, nausea, vomiting, diarrhea, or rash.  Patient states that she is scheduled to see a new PCP at the end of December.  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 03/05/2020   S/P VH (vaginal hysterectomy) 03/05/2020    Past Surgical History:  Procedure Laterality Date   ABDOMINAL SURGERY     lacerated liver repaired   ANTERIOR AND POSTERIOR REPAIR N/A 03/05/2020   Procedure: ANTERIOR (CYSTOCELE) AND POSTERIOR REPAIR (RECTOCELE);  Surgeon: Edsel Norleen GAILS, MD;  Location: [facility];  Service: Gynecology;  Laterality: N/A;   CARDIAC CATHETERIZATION     CHOLECYSTECTOMY  10 yrs ago   EXTRACORPOREAL SHOCK WAVE LITHOTRIPSY Left 04/12/2013   Procedure: EXTRACORPOREAL SHOCK WAVE LITHOTRIPSY (ESWL) LEFT RENAL CALCULUS;  Surgeon: Emery LILLETTE Blaze, MD;  Location: [facility];  Service: Urology;  Laterality: Left;   LASER ABLATION     uterus   NOSE SURGERY     TUBAL LIGATION     VAGINAL HYSTERECTOMY Bilateral 03/05/2020   Procedure: HYSTERECTOMY VAGINAL;  Surgeon:  Edsel Norleen GAILS, MD;  Location: [facility];  Service: Gynecology;  Laterality: Bilateral;    OB History     Gravida  2   Para  2   Term  2   Preterm      AB      Living  2      SAB      IAB      Ectopic      Multiple      Live Births  2            Home Medications    Prior to Admission medications   Medication Sig Start Date End Date Taking? Authorizing Provider  ALPRAZolam (XANAX) 1 MG tablet Take 1 mg by mouth daily as needed (anxiety).  01/08/20   [provider]  atorvastatin (LIPITOR) 20 MG tablet Take 1 tablet (20 mg total) by mouth at bedtime. 10/06/24   Leath-Warren, Etta PARAS, NP  doxycycline  (VIBRAMYCIN ) 100 MG capsule Take 1 capsule (100 mg total) by mouth 2 (two) times daily. 05/26/21   Arloa Suzen RAMAN, NP  doxycycline  (VIBRAMYCIN ) 100 MG capsule Take 1 capsule (100 mg total) by mouth 2 (two) times daily. 07/08/24   Stuart Vernell Norris, PA-C  estrogens , conjugated, (PREMARIN ) 0.625 MG tablet Take 0.625 mg by mouth daily. Take daily for 21 days then do not take for 7 days.    [provider]  lisinopril  (  ZESTRIL ) 10 MG tablet Take 1 tablet (10 mg total) by mouth at bedtime. 10/06/24   Leath-Warren, Etta PARAS, NP  predniSONE  (DELTASONE ) 20 MG tablet Take 2 PO QAM  X 3 days then  1 PO QAM x 3 days 05/13/21   Alvia Corean CROME, FNP  triamcinolone  cream (KENALOG ) 0.1 % Apply 1 application topically 2 (two) times daily. 05/12/21   Arloa Suzen RAMAN, NP  valACYclovir (VALTREX) 500 MG tablet Take 500 mg by mouth daily as needed (outbreaks).  01/22/20   [provider]  zonisamide (ZONEGRAN) 100 MG capsule Take 1 capsule (100 mg total) by mouth daily. 10/06/24   Leath-Warren, Etta PARAS, NP    Family History Family History  Problem Relation Age of Onset   Hypertension Sister    Asthma Sister     Social History Social History   Tobacco Use   Smoking status: Never   Smokeless tobacco: Never  Vaping Use   Vaping status:  Never Used  Substance Use Topics   Alcohol use: No   Drug use: No     Allergies   Flagyl [metronidazole hcl], Latex, Oxycodone, Penicillins, and Sulfa antibiotics   Review of Systems Review of Systems Per HPI  Physical Exam Triage Vital Signs ED Triage Vitals  Encounter Vitals Group     BP 10/06/24 1047 (!) 148/88     Girls Systolic BP Percentile --      Girls Diastolic BP Percentile --      Boys Systolic BP Percentile --      Boys Diastolic BP Percentile --      Pulse Rate 10/06/24 1047 64     Resp 10/06/24 1047 16     Temp 10/06/24 1047 98.7 F (37.1 C)     Temp Source 10/06/24 1047 Oral     SpO2 10/06/24 1047 98 %     Weight --      Height --      Head Circumference --      Peak Flow --      Pain Score 10/06/24 1046 0     Pain Loc --      Pain Education --      Exclude from Growth Chart --    No data found.  Updated Vital Signs BP (!) 148/88 (BP Location: Right Arm)   Pulse 64   Temp 98.7 F (37.1 C) (Oral)   Resp 16   LMP 10/10/2011   SpO2 98%   Visual Acuity Right Eye Distance:   Left Eye Distance:   Bilateral Distance:    Right Eye Near:   Left Eye Near:    Bilateral Near:     Physical Exam Vitals and nursing note reviewed.  Constitutional:      General: She is not in acute distress.    Appearance: Normal appearance.  HENT:     Head: Normocephalic.     Nose: Nose normal.     Mouth/Throat:     Mouth: Mucous membranes are moist.  Eyes:     Extraocular Movements: Extraocular movements intact.     Conjunctiva/sclera: Conjunctivae normal.     Pupils: Pupils are equal, round, and reactive to light.  Cardiovascular:     Rate and Rhythm: Normal rate and regular rhythm.     Pulses: Normal pulses.     Heart sounds: Normal heart sounds.  Pulmonary:     Effort: Pulmonary effort is normal. No respiratory distress.     Breath sounds: Normal breath sounds. No stridor.  No wheezing, rhonchi or rales.  Abdominal:     General: Bowel sounds are  normal.     Palpations: Abdomen is soft.     Tenderness: There is no abdominal tenderness.  Musculoskeletal:     Cervical back: Normal range of motion.  Skin:    General: Skin is warm and dry.  Neurological:     General: No focal deficit present.     Mental Status: She is alert and oriented to person, place, and time.  Psychiatric:        Mood and Affect: Mood normal.        Behavior: Behavior normal.      UC Treatments / Results  Labs (all labs ordered are listed, but only abnormal results are displayed) Labs Reviewed - No data to display  EKG   Radiology No results found.  Procedures Procedures (including critical care time)  Medications Ordered in UC Medications - No data to display  Initial Impression / Assessment and Plan / UC Course  I have reviewed the triage vital signs and the nursing notes.  Pertinent labs & imaging results that were available during my care of the patient were reviewed by me and considered in my medical decision making (see chart for details).  Patient presents for medication refills.  Patient is requesting refills of atorvastatin, lisinopril , and zonisamide.  60-day refills were provided until patient can see her new PCP.  Patient advised to follow-up with PCP as scheduled.  Patient was in agreement with this plan of care and verbalizes understanding.  All questions were answered.  Patient stable for discharge.  Final Clinical Impressions(s) / UC Diagnoses   Final diagnoses:  Medication refill  History of hypertension  History of high cholesterol  History of migraine     Discharge Instructions      Take medication as prescribed. Follow-up with your primary care physician as scheduled. Follow-up as needed.     ED Prescriptions     Medication Sig Dispense Auth. Provider   atorvastatin (LIPITOR) 20 MG tablet Take 1 tablet (20 mg total) by mouth at bedtime. 30 tablet Leath-Warren, Etta PARAS, NP   lisinopril  (ZESTRIL ) 10 MG  tablet Take 1 tablet (10 mg total) by mouth at bedtime. 60 tablet Leath-Warren, Etta PARAS, NP   zonisamide (ZONEGRAN) 100 MG capsule Take 1 capsule (100 mg total) by mouth daily. 60 capsule Leath-Warren, Etta PARAS, NP      PDMP not reviewed this encounter.   Gilmer Etta PARAS, NP 10/06/24 1106

## 2024-10-06 NOTE — ED Triage Notes (Signed)
 Pt reports to UC for a med refill of Lisinopril , atorvastin, and zonegran.

## 2024-10-06 NOTE — Discharge Instructions (Signed)
 Take medication as prescribed. Follow-up with your primary care physician as scheduled. Follow-up as needed.

## 2024-12-08 ENCOUNTER — Ambulatory Visit: Payer: Self-pay | Admitting: Family Medicine

## 2024-12-14 ENCOUNTER — Ambulatory Visit
Admission: EM | Admit: 2024-12-14 | Discharge: 2024-12-14 | Disposition: A | Discharge status: Home / Self Care | Source: Home / Self Care

## 2024-12-14 DIAGNOSIS — R079 Chest pain, unspecified: Secondary | ICD-10-CM

## 2024-12-14 DIAGNOSIS — F418 Other specified anxiety disorders: Secondary | ICD-10-CM

## 2024-12-14 DIAGNOSIS — E785 Hyperlipidemia, unspecified: Secondary | ICD-10-CM

## 2024-12-14 DIAGNOSIS — G43809 Other migraine, not intractable, without status migrainosus: Secondary | ICD-10-CM

## 2024-12-14 DIAGNOSIS — Z79890 Hormone replacement therapy (postmenopausal): Secondary | ICD-10-CM | POA: Diagnosis not present

## 2024-12-14 DIAGNOSIS — I10 Essential (primary) hypertension: Secondary | ICD-10-CM | POA: Diagnosis not present

## 2024-12-14 MED ORDER — ZONISAMIDE 100 MG PO CAPS: 100.0000 mg | ORAL_CAPSULE | Freq: Every day | ORAL | 2 refills | Status: AC

## 2024-12-14 MED ORDER — LISINOPRIL 10 MG PO TABS: 10.0000 mg | ORAL_TABLET | Freq: Every day | ORAL | 2 refills | Status: AC

## 2024-12-14 MED ORDER — ESTRADIOL 0.06 MG/24HR TD PTWK: 1.0000 | MEDICATED_PATCH | TRANSDERMAL | 2 refills | Status: AC

## 2024-12-14 MED ORDER — ATORVASTATIN CALCIUM 40 MG PO TABS: 40.0000 mg | ORAL_TABLET | Freq: Every day | ORAL | 2 refills | Status: AC
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
## 2024-12-14 NOTE — ED Triage Notes (Signed)
 Pt reports refills on several medicines, atorvastatin  40 mg, lisinopril  10 mg, zonisamide   100 mg, estradiol patch 0.06 mg pt states she  had an appointment with a primary but they they called and said she is now on maternity leave and she wont have an appointment until April.

## 2024-12-14 NOTE — Discharge Instructions (Signed)
 I have refilled your medications.  We have obtained some labs to make sure your kidney and liver functions are stable at this time.  Your EKG did not show any emergent changes, however I do recommend bringing your symptoms up with your primary care provider at your establish care visit and following up sooner if symptoms worsen at any time.

## 2024-12-17 LAB — COMPREHENSIVE METABOLIC PANEL WITH GFR
ALT: 28 IU/L (ref 0–32)
AST: 22 IU/L (ref 0–40)
Albumin: 4.5 g/dL (ref 3.9–4.9)
Alkaline Phosphatase: 134 IU/L (ref 49–135)
BUN/Creatinine Ratio: 12 (ref 12–28)
BUN: 10 mg/dL (ref 8–27)
Bilirubin Total: 0.4 mg/dL (ref 0.0–1.2)
CO2: 20 mmol/L (ref 20–29)
Calcium: 9.7 mg/dL (ref 8.7–10.3)
Chloride: 107 mmol/L — ABNORMAL HIGH (ref 96–106)
Creatinine, Ser: 0.82 mg/dL (ref 0.57–1.00)
Globulin, Total: 2.8 g/dL (ref 1.5–4.5)
Glucose: 79 mg/dL (ref 70–99)
Potassium: 5 mmol/L (ref 3.5–5.2)
Sodium: 143 mmol/L (ref 134–144)
Total Protein: 7.3 g/dL (ref 6.0–8.5)
eGFR: 81 mL/min/1.73

## 2024-12-18 ENCOUNTER — Ambulatory Visit (HOSPITAL_COMMUNITY): Payer: Self-pay

## 2025-03-14 ENCOUNTER — Ambulatory Visit
